# Patient Record
Sex: Female | Born: 1965 | Race: White | Hispanic: No | Smoking: Never smoker
Health system: Southern US, Community
[De-identification: ages and names within clinical notes are randomized; demographics above are authoritative.]

## PROBLEM LIST (undated history)

## (undated) DIAGNOSIS — T4145XA Adverse effect of unspecified anesthetic, initial encounter: Secondary | ICD-10-CM

## (undated) DIAGNOSIS — Z9889 Other specified postprocedural states: Secondary | ICD-10-CM

## (undated) DIAGNOSIS — Z87442 Personal history of urinary calculi: Secondary | ICD-10-CM

## (undated) DIAGNOSIS — C801 Malignant (primary) neoplasm, unspecified: Secondary | ICD-10-CM

## (undated) DIAGNOSIS — R112 Nausea with vomiting, unspecified: Secondary | ICD-10-CM

## (undated) DIAGNOSIS — T8859XA Other complications of anesthesia, initial encounter: Secondary | ICD-10-CM

## (undated) DIAGNOSIS — D045 Carcinoma in situ of skin of trunk: Secondary | ICD-10-CM

## (undated) HISTORY — PX: CHOLECYSTECTOMY: SHX55

## (undated) HISTORY — PX: APPENDECTOMY: SHX54

## (undated) HISTORY — PX: ABDOMINAL HYSTERECTOMY: SHX81

---

## 2006-05-05 ENCOUNTER — Ambulatory Visit (HOSPITAL_BASED_OUTPATIENT_CLINIC_OR_DEPARTMENT_OTHER): Admission: RE | Admit: 2006-05-05 | Discharge: 2006-05-05 | Payer: Self-pay | Admitting: Orthopedic Surgery

## 2007-01-21 ENCOUNTER — Emergency Department (HOSPITAL_COMMUNITY): Admission: EM | Admit: 2007-01-21 | Discharge: 2007-01-21 | Payer: Self-pay | Admitting: Emergency Medicine

## 2007-12-18 ENCOUNTER — Emergency Department (HOSPITAL_COMMUNITY): Admission: EM | Admit: 2007-12-18 | Discharge: 2007-12-18 | Payer: Self-pay | Admitting: Emergency Medicine

## 2010-11-20 NOTE — Op Note (Signed)
[name], Tonya Bowman               ACCOUNT NO.:  [id number]   MEDICAL RECORD NO.:  [id number]          PATIENT TYPE:  AMB   LOCATION:  DSC                          FACILITY:  [facility]   PHYSICIAN:  Nadara Mustard, MD     DATE OF BIRTH:  08-03-1965   DATE OF PROCEDURE:  05/05/2006  DATE OF DISCHARGE:                                 OPERATIVE REPORT   PREOPERATIVE DIAGNOSIS:  Frozen left shoulder.   POSTOPERATIVE DIAGNOSIS:  Frozen left shoulder.   PROCEDURE:  1. Left shoulder manipulation under anesthesia.  2. Left shoulder arthroscopy with debridement rotator cuff tear,      debridement of subscapularis adhesions, and subacromial decompression.   SURGEON:  Nadara Mustard, MD   ANESTHESIA:  General plus interscalene block.   ESTIMATED BLOOD LOSS:  Minimal.   ANTIBIOTICS:  None.   DRAINS:  None.   COMPLICATIONS:  None.   DISPOSITION:  To PACU in stable condition.   PROCEDURE:  The patient is a 45 year old woman with a frozen left shoulder.  She has failed conservative care.  MRI scan showed no significant bony or  intra-articular pathology and the patient presents at this time for  arthroscopic intervention due to failure of conservative care and pain with  activities of daily living.  Risks and benefits were discussed including  infection, neurovascular injury, persistent pain, need for additional  surgery.  The patient states she understands and wished proceed at this  time.   DESCRIPTION OF PROCEDURE:  The patient was brought to OR room one after  undergoing interscalene block.  After adequate level of anesthesia obtained  with general anesthetic, the patient was placed in the beach-chair position  and her left upper extremity was prepped using DuraPrep and draped into a  sterile field.  The scope was inserted through the posterior portal and  anterior portal was established with outside-in technique.  Manipulation  under anesthesia relieved the adhesions from the  subscapularis.  The VAPR  wand was used to further debride the subscapularis adhesions.  The patient  had significant synovitis involving the entire joint and the synovitis was  debrided with the VAPR wand.  The shaver was also used for debridement of a  SLAP lesion.  The biceps tendon was intact.  The subscapularis did have some  degenerative tearing which was debrided.  The insertion of the rotator cuff  on the humeral head was stable and intact and the patient had good articular  cartilage of the glenoid and humeral head was some mild grade 2 changes of  the humeral head articular cartilage.  After further debridement the  instruments were removed.  The scope was then inserted from the posterior  portal in the subacromial space and a lateral portal was established.  The  patient did have a tight subacromial space due to bony spurs from the  anterior aspect of the acromion and the patient underwent a subacromial  decompression and debridement of synovitis in the subacromial space.  After  debridement the instruments were removed.  The joint was infused with total  of 20 mL  of half percent Marcaine plain and 4 mg of morphine.  The incisions  were closed using 4-0 nylon.  The wound was covered with Adaptic orthopedic  sponges, ABD dressing and Hypafix tape.  The patient was placed in a sling,  extubated, taken to PACU in stable condition.  Plan for discharge to home.  Follow-up in office in 2 weeks.  Prescription for Vicodin and Darvocet and  starting range of motion exercises tomorrow.      Nadara Mustard, MD  Electronically Signed     MVD/MEDQ  D:  05/05/2006  T:  05/06/2006  Job:  [id number]

## 2011-04-19 LAB — URINALYSIS, ROUTINE W REFLEX MICROSCOPIC
Bilirubin Urine: NEGATIVE
Ketones, ur: NEGATIVE
Nitrite: NEGATIVE
Urobilinogen, UA: 0.2
pH: 8

## 2011-04-19 LAB — URINE CULTURE: Colony Count: 15000

## 2011-04-19 LAB — URINE MICROSCOPIC-ADD ON

## 2011-08-17 ENCOUNTER — Other Ambulatory Visit: Payer: Self-pay | Admitting: Family Medicine

## 2011-08-17 DIAGNOSIS — Z1231 Encounter for screening mammogram for malignant neoplasm of breast: Secondary | ICD-10-CM

## 2011-08-23 ENCOUNTER — Ambulatory Visit
Admission: RE | Admit: 2011-08-23 | Discharge: 2011-08-23 | Disposition: A | Discharge status: Home / Self Care | Payer: 59 | Source: Ambulatory Visit | Attending: Family Medicine | Admitting: Family Medicine

## 2011-08-23 DIAGNOSIS — Z1231 Encounter for screening mammogram for malignant neoplasm of breast: Secondary | ICD-10-CM

## 2014-04-18 ENCOUNTER — Other Ambulatory Visit: Payer: Self-pay

## 2014-04-18 DIAGNOSIS — Z1231 Encounter for screening mammogram for malignant neoplasm of breast: Secondary | ICD-10-CM

## 2014-04-23 ENCOUNTER — Ambulatory Visit
Admission: RE | Admit: 2014-04-23 | Discharge: 2014-04-23 | Disposition: A | Discharge status: Home / Self Care | Payer: 59 | Source: Ambulatory Visit

## 2014-04-23 ENCOUNTER — Inpatient Hospital Stay: Admission: RE | Admit: 2014-04-23 | Payer: 59 | Source: Ambulatory Visit

## 2014-04-23 DIAGNOSIS — Z1231 Encounter for screening mammogram for malignant neoplasm of breast: Secondary | ICD-10-CM

## 2014-04-25 ENCOUNTER — Other Ambulatory Visit: Payer: Self-pay | Admitting: Family Medicine

## 2014-04-25 DIAGNOSIS — R928 Other abnormal and inconclusive findings on diagnostic imaging of breast: Secondary | ICD-10-CM

## 2014-05-08 ENCOUNTER — Ambulatory Visit: Payer: 59

## 2014-05-08 ENCOUNTER — Ambulatory Visit
Admission: RE | Admit: 2014-05-08 | Discharge: 2014-05-08 | Disposition: A | Discharge status: Home / Self Care | Payer: 59 | Source: Ambulatory Visit | Attending: Family Medicine | Admitting: Family Medicine

## 2014-05-08 DIAGNOSIS — R928 Other abnormal and inconclusive findings on diagnostic imaging of breast: Secondary | ICD-10-CM

## 2016-06-15 ENCOUNTER — Encounter (INDEPENDENT_AMBULATORY_CARE_PROVIDER_SITE_OTHER): Payer: Self-pay | Admitting: *Deleted

## 2016-12-06 ENCOUNTER — Encounter (INDEPENDENT_AMBULATORY_CARE_PROVIDER_SITE_OTHER): Payer: Self-pay | Admitting: *Deleted

## 2018-10-03 ENCOUNTER — Other Ambulatory Visit: Payer: Self-pay | Admitting: Urology

## 2018-10-03 ENCOUNTER — Encounter (HOSPITAL_COMMUNITY)
Admission: AD | Disposition: A | Discharge status: Home / Self Care | Payer: Self-pay | Source: Ambulatory Visit | Attending: Urology

## 2018-10-03 ENCOUNTER — Ambulatory Visit (HOSPITAL_COMMUNITY)
Admission: AD | Admit: 2018-10-03 | Discharge: 2018-10-03 | Disposition: A | Discharge status: Home / Self Care | Payer: 59 | Source: Ambulatory Visit | Attending: Urology | Admitting: Urology

## 2018-10-03 ENCOUNTER — Ambulatory Visit (HOSPITAL_COMMUNITY): Payer: 59 | Admitting: Certified Registered Nurse Anesthetist

## 2018-10-03 ENCOUNTER — Emergency Department (HOSPITAL_COMMUNITY): Payer: 59

## 2018-10-03 ENCOUNTER — Ambulatory Visit (HOSPITAL_COMMUNITY): Payer: 59

## 2018-10-03 ENCOUNTER — Encounter (HOSPITAL_COMMUNITY): Payer: Self-pay | Admitting: *Deleted

## 2018-10-03 ENCOUNTER — Emergency Department (HOSPITAL_COMMUNITY)
Admission: EM | Admit: 2018-10-03 | Discharge: 2018-10-03 | Disposition: A | Discharge status: Home / Self Care | Payer: 59 | Source: Home / Self Care | Attending: Emergency Medicine | Admitting: Emergency Medicine

## 2018-10-03 ENCOUNTER — Other Ambulatory Visit: Payer: Self-pay

## 2018-10-03 DIAGNOSIS — N132 Hydronephrosis with renal and ureteral calculous obstruction: Secondary | ICD-10-CM | POA: Insufficient documentation

## 2018-10-03 DIAGNOSIS — N201 Calculus of ureter: Secondary | ICD-10-CM

## 2018-10-03 HISTORY — DX: Other complications of anesthesia, initial encounter: T88.59XA

## 2018-10-03 HISTORY — DX: Personal history of urinary calculi: Z87.442

## 2018-10-03 HISTORY — DX: Other specified postprocedural states: R11.2

## 2018-10-03 HISTORY — DX: Adverse effect of unspecified anesthetic, initial encounter: T41.45XA

## 2018-10-03 HISTORY — DX: Nausea with vomiting, unspecified: Z98.890

## 2018-10-03 HISTORY — PX: CYSTOSCOPY/URETEROSCOPY/HOLMIUM LASER/STENT PLACEMENT: SHX6546

## 2018-10-03 HISTORY — DX: Nausea with vomiting, unspecified: R11.2

## 2018-10-03 LAB — COMPREHENSIVE METABOLIC PANEL
ALBUMIN: 4 g/dL (ref 3.5–5.0)
ALK PHOS: 92 U/L (ref 38–126)
ALT: 28 U/L (ref 0–44)
ANION GAP: 13 (ref 5–15)
AST: 28 U/L (ref 15–41)
BUN: 13 mg/dL (ref 6–20)
CALCIUM: 9.3 mg/dL (ref 8.9–10.3)
CO2: 19 mmol/L — AB (ref 22–32)
Chloride: 107 mmol/L (ref 98–111)
Creatinine, Ser: 0.73 mg/dL (ref 0.44–1.00)
GFR calc Af Amer: >60 mL/min (ref >60)
GFR calc non Af Amer: >60 mL/min (ref >60)
GLUCOSE: 109 mg/dL — AB (ref 70–99)
Potassium: 3 mmol/L — ABNORMAL LOW (ref 3.5–5.1)
SODIUM: 139 mmol/L (ref 135–145)
TOTAL PROTEIN: 7.8 g/dL (ref 6.5–8.1)
Total Bilirubin: 0.3 mg/dL (ref 0.3–1.2)

## 2018-10-03 LAB — CBC WITH DIFFERENTIAL/PLATELET
Abs Immature Granulocytes: 0.09 K/uL — ABNORMAL HIGH (ref 0.00–0.07)
Basophils Absolute: 0.1 K/uL (ref 0.0–0.1)
Basophils Relative: 1 %
Eosinophils Absolute: 0.1 K/uL (ref 0.0–0.5)
Eosinophils Relative: 1 %
HCT: 42 % (ref 36.0–46.0)
Hemoglobin: 14.2 g/dL (ref 12.0–15.0)
Immature Granulocytes: 1 %
Lymphocytes Relative: 33 %
Lymphs Abs: 3.9 K/uL (ref 0.7–4.0)
MCH: 30.3 pg (ref 26.0–34.0)
MCHC: 33.8 g/dL (ref 30.0–36.0)
MCV: 89.6 fL (ref 80.0–100.0)
Monocytes Absolute: 0.9 K/uL (ref 0.1–1.0)
Monocytes Relative: 7 %
Neutro Abs: 6.6 K/uL (ref 1.7–7.7)
Neutrophils Relative %: 57 %
Platelets: 345 K/uL (ref 150–400)
RBC: 4.69 MIL/uL (ref 3.87–5.11)
RDW: 12.8 % (ref 11.5–15.5)
WBC: 11.7 K/uL — ABNORMAL HIGH (ref 4.0–10.5)
nRBC: 0 % (ref 0.0–0.2)

## 2018-10-03 LAB — LIPASE, BLOOD: Lipase: 43 U/L (ref 11–51)

## 2018-10-03 SURGERY — CYSTOSCOPY/URETEROSCOPY/HOLMIUM LASER/STENT PLACEMENT
Anesthesia: General | Laterality: Right

## 2018-10-03 MED ORDER — ONDANSETRON HCL 4 MG/2ML IJ SOLN
INTRAMUSCULAR | Status: DC | PRN
  Administered 2018-10-03: 4 mg via INTRAVENOUS

## 2018-10-03 MED ORDER — FENTANYL CITRATE (PF) 100 MCG/2ML IJ SOLN
INTRAMUSCULAR | Status: DC | PRN
  Administered 2018-10-03: 25 ug via INTRAVENOUS
  Administered 2018-10-03: 50 ug via INTRAVENOUS
  Administered 2018-10-03: 25 ug via INTRAVENOUS

## 2018-10-03 MED ORDER — FENTANYL CITRATE (PF) 100 MCG/2ML IJ SOLN
INTRAMUSCULAR | Status: AC
  Filled 2018-10-03: qty 2

## 2018-10-03 MED ORDER — FENTANYL CITRATE (PF) 100 MCG/2ML IJ SOLN
25.0000 ug | INTRAMUSCULAR | Status: DC | PRN
  Administered 2018-10-03 (×2): 25 ug via INTRAVENOUS

## 2018-10-03 MED ORDER — SODIUM CHLORIDE 0.9 % IR SOLN
Status: DC | PRN
  Administered 2018-10-03: 3000 mL via INTRAVESICAL

## 2018-10-03 MED ORDER — PROMETHAZINE HCL 25 MG/ML IJ SOLN
INTRAMUSCULAR | Status: AC
  Filled 2018-10-03: qty 1

## 2018-10-03 MED ORDER — KETOROLAC TROMETHAMINE 30 MG/ML IJ SOLN: 30.0000 mg | Freq: Once | INTRAMUSCULAR | Status: DC | PRN

## 2018-10-03 MED ORDER — HYDROCODONE-ACETAMINOPHEN 5-325 MG PO TABS: 1.0000 | ORAL_TABLET | ORAL | 0 refills | Status: DC | PRN

## 2018-10-03 MED ORDER — PROPOFOL 10 MG/ML IV BOLUS
INTRAVENOUS | Status: DC | PRN
  Administered 2018-10-03: 150 mg via INTRAVENOUS

## 2018-10-03 MED ORDER — TAMSULOSIN HCL 0.4 MG PO CAPS: 0.4000 mg | ORAL_CAPSULE | Freq: Every day | ORAL | 3 refills | Status: DC

## 2018-10-03 MED ORDER — PROPOFOL 10 MG/ML IV BOLUS
INTRAVENOUS | Status: AC
  Filled 2018-10-03: qty 20

## 2018-10-03 MED ORDER — DEXAMETHASONE SODIUM PHOSPHATE 10 MG/ML IJ SOLN
INTRAMUSCULAR | Status: DC | PRN
  Administered 2018-10-03: 5 mg via INTRAVENOUS

## 2018-10-03 MED ORDER — MORPHINE SULFATE (PF) 4 MG/ML IV SOLN
4.0000 mg | Freq: Once | INTRAVENOUS | Status: AC
  Administered 2018-10-03: 4 mg via INTRAVENOUS
  Filled 2018-10-03: qty 1

## 2018-10-03 MED ORDER — IOHEXOL 300 MG/ML  SOLN
INTRAMUSCULAR | Status: DC | PRN
  Administered 2018-10-03: 10 mL via URETHRAL

## 2018-10-03 MED ORDER — LIDOCAINE 2% (20 MG/ML) 5 ML SYRINGE
INTRAMUSCULAR | Status: AC
  Filled 2018-10-03: qty 5

## 2018-10-03 MED ORDER — POTASSIUM CHLORIDE CRYS ER 20 MEQ PO TBCR: 40.0000 meq | EXTENDED_RELEASE_TABLET | Freq: Every day | ORAL | 0 refills | Status: DC

## 2018-10-03 MED ORDER — KETOROLAC TROMETHAMINE 30 MG/ML IJ SOLN
30.0000 mg | Freq: Once | INTRAMUSCULAR | Status: AC
  Administered 2018-10-03: 30 mg via INTRAVENOUS
  Filled 2018-10-03: qty 1

## 2018-10-03 MED ORDER — MIDAZOLAM HCL 2 MG/2ML IJ SOLN
INTRAMUSCULAR | Status: AC
  Filled 2018-10-03: qty 2

## 2018-10-03 MED ORDER — HYDROMORPHONE HCL 1 MG/ML IJ SOLN
1.0000 mg | Freq: Once | INTRAMUSCULAR | Status: AC
  Administered 2018-10-03: 1 mg via INTRAVENOUS
  Filled 2018-10-03: qty 1

## 2018-10-03 MED ORDER — SCOPOLAMINE 1 MG/3DAYS TD PT72
1.0000 | MEDICATED_PATCH | TRANSDERMAL | Status: DC
  Administered 2018-10-03: 1.5 mg via TRANSDERMAL

## 2018-10-03 MED ORDER — KETOROLAC TROMETHAMINE 10 MG PO TABS: 10.0000 mg | ORAL_TABLET | Freq: Four times a day (QID) | ORAL | 0 refills | Status: DC | PRN

## 2018-10-03 MED ORDER — PROMETHAZINE HCL 25 MG/ML IJ SOLN
6.2500 mg | INTRAMUSCULAR | Status: DC | PRN
  Administered 2018-10-03: 6.25 mg via INTRAVENOUS

## 2018-10-03 MED ORDER — LIDOCAINE 2% (20 MG/ML) 5 ML SYRINGE
INTRAMUSCULAR | Status: AC
  Filled 2018-10-03: qty 10

## 2018-10-03 MED ORDER — MIDAZOLAM HCL 5 MG/5ML IJ SOLN
INTRAMUSCULAR | Status: DC | PRN
  Administered 2018-10-03: 2 mg via INTRAVENOUS

## 2018-10-03 MED ORDER — LACTATED RINGERS IV SOLN
INTRAVENOUS | Status: DC
  Administered 2018-10-03 (×2): via INTRAVENOUS

## 2018-10-03 MED ORDER — LIDOCAINE 2% (20 MG/ML) 5 ML SYRINGE
INTRAMUSCULAR | Status: DC | PRN
  Administered 2018-10-03: 60 mg via INTRAVENOUS

## 2018-10-03 MED ORDER — ONDANSETRON HCL 4 MG PO TABS: 4.0000 mg | ORAL_TABLET | Freq: Three times a day (TID) | ORAL | 3 refills | Status: DC | PRN

## 2018-10-03 MED ORDER — BELLADONNA ALKALOIDS-OPIUM 16.2-30 MG RE SUPP
RECTAL | Status: AC
  Filled 2018-10-03: qty 1

## 2018-10-03 MED ORDER — INDIGOTINDISULFONATE SODIUM 8 MG/ML IJ SOLN
INTRAMUSCULAR | Status: AC
  Filled 2018-10-03: qty 5

## 2018-10-03 MED ORDER — ONDANSETRON 4 MG PO TBDP
ORAL_TABLET | ORAL | Status: AC
  Administered 2018-10-03: 15:00:00
  Filled 2018-10-03: qty 1

## 2018-10-03 MED ORDER — ONDANSETRON HCL 4 MG/2ML IJ SOLN
4.0000 mg | Freq: Once | INTRAMUSCULAR | Status: AC
  Administered 2018-10-03: 4 mg via INTRAVENOUS
  Filled 2018-10-03: qty 2

## 2018-10-03 MED ORDER — SUCCINYLCHOLINE CHLORIDE 200 MG/10ML IV SOSY
PREFILLED_SYRINGE | INTRAVENOUS | Status: DC | PRN
  Administered 2018-10-03: 120 mg via INTRAVENOUS

## 2018-10-03 MED ORDER — ONDANSETRON HCL 4 MG/2ML IJ SOLN
INTRAMUSCULAR | Status: AC
  Filled 2018-10-03: qty 2

## 2018-10-03 MED ORDER — SCOPOLAMINE 1 MG/3DAYS TD PT72
MEDICATED_PATCH | TRANSDERMAL | Status: AC
  Filled 2018-10-03: qty 1

## 2018-10-03 MED ORDER — ALBUTEROL SULFATE HFA 108 (90 BASE) MCG/ACT IN AERS
INHALATION_SPRAY | RESPIRATORY_TRACT | Status: AC
  Filled 2018-10-03: qty 6.7

## 2018-10-03 MED ORDER — CIPROFLOXACIN IN D5W 400 MG/200ML IV SOLN
400.0000 mg | INTRAVENOUS | Status: AC
  Administered 2018-10-03: 400 mg via INTRAVENOUS
  Filled 2018-10-03: qty 200

## 2018-10-03 SURGICAL SUPPLY — 23 items
BAG URO CATCHER STRL LF (MISCELLANEOUS) ×3 IMPLANT
BASKET LASER NITINOL 1.9FR (BASKET) ×2 IMPLANT
BASKET ZERO TIP NITINOL 2.4FR (BASKET) IMPLANT
BSKT STON RTRVL 120 1.9FR (BASKET) ×1
BSKT STON RTRVL ZERO TP 2.4FR (BASKET)
CATH INTERMIT  6FR 70CM (CATHETERS) IMPLANT
CATH URET 5FR 28IN CONE TIP (BALLOONS)
CATH URET 5FR 70CM CONE TIP (BALLOONS) IMPLANT
CLOTH BEACON ORANGE TIMEOUT ST (SAFETY) ×3 IMPLANT
COVER WAND RF STERILE (DRAPES) IMPLANT
EXTRACTOR STONE 1.7FRX115CM (UROLOGICAL SUPPLIES) IMPLANT
FIBER LASER FLEXIVA 365 (UROLOGICAL SUPPLIES) IMPLANT
FIBER LASER TRAC TIP (UROLOGICAL SUPPLIES) ×2 IMPLANT
GLOVE BIO SURGEON STRL SZ7.5 (GLOVE) ×3 IMPLANT
GOWN STRL REUS W/TWL XL LVL3 (GOWN DISPOSABLE) ×3 IMPLANT
GUIDEWIRE ANG ZIPWIRE 038X150 (WIRE) IMPLANT
GUIDEWIRE STR DUAL SENSOR (WIRE) ×3 IMPLANT
KIT TURNOVER KIT A (KITS) IMPLANT
MANIFOLD NEPTUNE II (INSTRUMENTS) ×3 IMPLANT
PACK CYSTO (CUSTOM PROCEDURE TRAY) ×3 IMPLANT
SHEATH URETERAL 12FRX28CM (UROLOGICAL SUPPLIES) IMPLANT
SHEATH URETERAL 12FRX35CM (MISCELLANEOUS) IMPLANT
TUBING UROLOGY SET (TUBING) ×3 IMPLANT

## 2018-10-03 NOTE — ED Provider Notes (Signed)
Emergency Department Provider Note   I have reviewed the triage vital signs and the nursing notes.   HISTORY  Chief Complaint Flank Pain   HPI Tonya Bowman is a 53 y.o. female with PSxH of appendectomy, cholecystectomy, and abdominal hysterectomy sent to the emergency department for acute onset right flank pain radiating to the back.  Patient states symptoms began abruptly while at work.  She had one episode of vomiting and several bowel movements which were not bloody.  She denies fevers.  No similar pain in the past.  Pain is severe and intermittently worsens.  No lower abdominal discomfort.  No UTI symptoms.  No hematuria.    No past medical history on file.  There are no active problems to display for this patient.   Allergies Penicillins  No family history on file.  Social History Social History   Tobacco Use  . Smoking status: Never Smoker  . Smokeless tobacco: Never Used  Substance Use Topics  . Alcohol use: Not Currently  . Drug use: Not Currently    Review of Systems  Constitutional: No fever/chills Eyes: No visual changes. ENT: No sore throat. Cardiovascular: Denies chest pain. Respiratory: Denies shortness of breath. Gastrointestinal: Severe right flank/abdominal pain. Positive nausea and vomiting x 1.   No diarrhea.  No constipation. Genitourinary: Negative for dysuria. Musculoskeletal: Negative for back pain. Skin: Negative for rash. Neurological: Negative for headaches, focal weakness or numbness.  10-point ROS otherwise negative.  ____________________________________________   PHYSICAL EXAM:  VITAL SIGNS: ED Triage Vitals  Enc Vitals Group     BP 10/03/18 1048 (!) 135/94     Pulse Rate 10/03/18 1048 79     Resp 10/03/18 1048 (!) 22     Temp 10/03/18 1048 98.3 F (36.8 C)     Temp Source 10/03/18 1048 Oral     SpO2 10/03/18 1048 100 %     Pain Score 10/03/18 1045 10   Constitutional: Alert and oriented. Frequently shifting in  bed. Appears uncomfortable.  Eyes: Conjunctivae are normal.  Head: Atraumatic. Nose: No congestion/rhinnorhea. Mouth/Throat: Mucous membranes are moist. Neck: No stridor.  Cardiovascular: Normal rate, regular rhythm. Good peripheral circulation. Grossly normal heart sounds.   Respiratory: Normal respiratory effort.  No retractions. Lungs CTAB. Gastrointestinal: Soft and non-tender to diffuse palpation in all 4 quadrants. No distention. Moderate right CVA tenderness to percussion.  Musculoskeletal: No lower extremity tenderness nor edema. No gross deformities of extremities. Neurologic:  Normal speech and language. No gross focal neurologic deficits are appreciated.  Skin:  Skin is warm, dry and intact. No rash noted.  ____________________________________________   LABS (all labs ordered are listed, but only abnormal results are displayed)  Labs Reviewed  COMPREHENSIVE METABOLIC PANEL - Abnormal; Notable for the following components:      Result Value   Potassium 3.0 (*)    CO2 19 (*)    Glucose, Bld 109 (*)    All other components within normal limits  CBC WITH DIFFERENTIAL/PLATELET - Abnormal; Notable for the following components:   WBC 11.7 (*)    Abs Immature Granulocytes 0.09 (*)    All other components within normal limits  URINE CULTURE  LIPASE, BLOOD  URINALYSIS, ROUTINE W REFLEX MICROSCOPIC   ____________________________________________  RADIOLOGY  Ct Renal Stone Study  Result Date: 10/03/2018 CLINICAL DATA:  Right flank pain. EXAM: CT ABDOMEN AND PELVIS WITHOUT CONTRAST TECHNIQUE: Multidetector CT imaging of the abdomen and pelvis was performed following the standard protocol without IV contrast.  COMPARISON:  CT abdomen pelvis dated Nov 05, 2009. FINDINGS: Lower chest: No acute abnormality. Hepatobiliary: No focal liver abnormality is seen. Status post cholecystectomy. Unchanged mild central intrahepatic and common bile duct dilatation, likely due to post  cholecystectomy state. Pancreas: Unremarkable. No pancreatic ductal dilatation or surrounding inflammatory changes. Spleen: Normal in size without focal abnormality. Adrenals/Urinary Tract: The adrenal glands are unremarkable. There is a 7 mm calculus in the distal right ureter approximately 1 cm from the UVJ with resultant mild right hydroureteronephrosis. Punctate nonobstructive left renal calculus. No hydronephrosis. The bladder is under distended. Stomach/Bowel: Stomach is within normal limits. Appendix is not visualized in this patient with history of prior appendectomy. No evidence of bowel wall thickening, distention, or inflammatory changes. Vascular/Lymphatic: No significant vascular findings are present. No enlarged abdominal or pelvic lymph nodes. Reproductive: Status post hysterectomy. No adnexal masses. Other: No abdominal wall hernia. No abdominopelvic ascites. No pneumoperitoneum. Musculoskeletal: No acute or significant osseous findings. L5 central superior endplate Schmorl's node, new since 2011. Slight interval increase in size of a superior right rectus abdominus intramuscular lipoma, now measuring 8 x 24 x 59 mm, previously 7 x 17 x 52 mm. IMPRESSION: 1. 7 mm calculus in the distal right ureter with resultant mild right hydroureteronephrosis. 2. Punctate nonobstructive left nephrolithiasis. Electronically Signed   By: Titus Dubin M.D.   On: 10/03/2018 12:47    ____________________________________________   PROCEDURES  Procedure(s) performed:   Procedures  None  ____________________________________________   INITIAL IMPRESSION / ASSESSMENT AND PLAN / ED COURSE  Pertinent labs & imaging results that were available during my care of the patient were reviewed by me and considered in my medical decision making (see chart for details).   Patient presents to the emergency department with acute onset right flank pain.  Presentation is most consistent with ureterolithiasis.   Patient has history of abdominal hysterectomy, appendectomy, cholecystectomy.  No tenderness to the anterior abdomen.  Lower suspicion for bowel obstruction.  Plan for CT renal along with labs and pain/nausea medication.  01:30 PM  Spoke with Dr. Gloriann Loan with Urology.  With a distal, 7 mm stone 1 cm from the UVJ.  Patient continues to have severe pain.  No significant relief with morphine or Toradol at this point.  I am giving Dilaudid.  Dr. Gloriann Loan can see the patient in the office this afternoon for possible intervention.  No symptoms to suspect a developing urosepsis.  UA with no evidence of infection.  Remaining lab work is largely unremarkable.  Patient is calling for a ride and will let us know when she has secured transportation.   02:05 PM  Patient has secured a ride home with her daughter who is en route. Provided an Rx for Toradol and Kdur. Discussed hypokalemia. Patient to drive to [facility] in [place] to see Dr. Gloriann Loan. They will prescribe pain Rx as needed. Patient understands discharge plan and instructions. Appears more comfortable here in the ED at the time of discharge.  ____________________________________________  FINAL CLINICAL IMPRESSION(S) / ED DIAGNOSES  Final diagnoses:  Ureterolithiasis     MEDICATIONS GIVEN DURING THIS VISIT:  Medications  morphine 4 MG/ML injection 4 mg (4 mg Intravenous Given 10/03/18 1106)  ondansetron (ZOFRAN) injection 4 mg (4 mg Intravenous Given 10/03/18 1106)  ketorolac (TORADOL) 30 MG/ML injection 30 mg (30 mg Intravenous Given 10/03/18 1139)  HYDROmorphone (DILAUDID) injection 1 mg (1 mg Intravenous Given 10/03/18 1336)     NEW OUTPATIENT MEDICATIONS STARTED DURING THIS VISIT:  New Prescriptions   KETOROLAC (TORADOL) 10 MG TABLET    Take 1 tablet (10 mg total) by mouth every 6 (six) hours as needed for moderate pain.   POTASSIUM CHLORIDE SA (K-DUR,KLOR-CON) 20 MEQ TABLET    Take 2 tablets (40 mEq total) by mouth daily for 5 days.     Note:  This document was prepared using Dragon voice recognition software and may include unintentional dictation errors.  Nanda Quinton, MD Emergency Medicine    Xavier Munger, [facility], MD 10/03/18 [phone]

## 2018-10-03 NOTE — H&P (Signed)
CC/HPI: CC: Right ureteral calculus  HPI:  10/03/2018  Patient presents with severe right-sided flank pain, nausea, vomiting. She denies fever or chill. She comes from the emergency department where she underwent a CT scan of the abdomen and pelvis without contrast that revealed a 7 mm distal right ureteral calculus with right hydroureteronephrosis. She also had a punctate nonobstructive left stone in the kidney. This her first event. Despite medication, she continues to have severe nausea, vomiting. She is also having continued pain. She desires intervention tonight. In the emergency department, she had very mild leukocytosis of 11.7. Creatinine is 0.73. Urinalysis in the office reveals moderate bacteria (25-30), 0-5 WBCs, 20-40 RBCs. She had negative nitrite, trace leukocyte.     ALLERGIES: penicillin Penicillins    MEDICATIONS: None   GU PSH: Hysterectomy Unilat SO - 2011      PSH Notes: Appendectomy, Gallbladder Surgery, Hysterectomy   NON-GU PSH: Appendectomy - 2011 Appendectomy (laparoscopic) Cholecystectomy (laparoscopic)    GU PMH: Dysuria, Dysuria - 2014 Oth GU systems Signs/Symptoms, Bladder pain - 2014      PMH Notes:  1898-07-05 00:00:00 - Note: Normal Routine History And Physical Adult   NON-GU PMH: Personal history of other diseases of the digestive system, History of esophageal reflux - 2014    FAMILY HISTORY: Depression - Father, Sister Diabetes - Mother Family Health Status Number - Runs In Family Hypertension - Father, Sister, Brother, Mother nephrolithiasis - Brother, Sister   SOCIAL HISTORY: No Social History     Notes: Marital History - Single, Caffeine Use, Tobacco Use, Occupation:, Alcohol Use   REVIEW OF SYSTEMS:    GU Review Female:   Patient denies frequent urination, hard to postpone urination, burning /pain with urination, get up at night to urinate, leakage of urine, stream starts and stops, trouble starting your stream, have to strain to urinate,  and being pregnant.  Gastrointestinal (Upper):   Patient reports nausea and vomiting. Patient denies indigestion/ heartburn.  Gastrointestinal (Lower):   Patient denies diarrhea and constipation.  Constitutional:   Patient denies fever, night sweats, weight loss, and fatigue.  Skin:   Patient denies skin rash/ lesion and itching.  Eyes:   Patient denies blurred vision and double vision.  Ears/ Nose/ Throat:   Patient denies sore throat and sinus problems.  Hematologic/Lymphatic:   Patient denies swollen glands and easy bruising.  Cardiovascular:   Patient denies leg swelling and chest pains.  Respiratory:   Patient denies cough and shortness of breath.  Endocrine:   Patient denies excessive thirst.  Musculoskeletal:   Patient reports back pain. Patient denies joint pain.  Neurological:   Patient denies headaches and dizziness.  Psychologic:   Patient denies depression and anxiety.   VITAL SIGNS:      10/03/2018 03:19 PM  BP 138/88 mmHg  Heart Rate 112 /min   MULTI-SYSTEM PHYSICAL EXAMINATION:    Constitutional: Well-nourished. No physical deformities. Appears uncomfortable secondary to renal colic and nausea  Respiratory: No labored breathing, no use of accessory muscles.   Cardiovascular: Normal temperature, adequate perfusion of extremities  Skin: No paleness, no jaundice  Neurologic / Psychiatric: Oriented to time, oriented to place, oriented to person. No depression, no anxiety, no agitation.  Gastrointestinal: No mass, no tenderness, no rigidity, non obese abdomen.   Eyes: Normal conjunctivae. Normal eyelids.  Musculoskeletal: Normal gait and station of head and neck.     PAST DATA REVIEWED:  Source Of History:  Patient  Lab Test Review:   CBC with  Diff, CMP  Records Review:   Previous Patient Records  X-Ray Review: C.T. Abdomen/Pelvis: Reviewed Films. Reviewed Report. Discussed With Patient.     PROCEDURES:          Urinalysis w/Scope Dipstick Dipstick Cont'd Micro   Color: Amber Bilirubin: Neg mg/dL WBC/hpf: 0 - 5/hpf  Appearance: Cloudy Ketones: Neg mg/dL RBC/hpf: 20 - 40/hpf  Specific Gravity: 1.015 Blood: 3+ ery/uL Bacteria: Mod (26-50/hpf)  pH: 8.0 Protein: 1+ mg/dL Cystals: Amorph Phosphates  Glucose: Neg mg/dL Urobilinogen: 0.2 mg/dL Casts: NS (Not Seen)    Nitrites: Neg Trichomonas: Not Present    Leukocyte Esterase: Trace leu/uL Mucous: Present      Epithelial Cells: 0 - 5/hpf      Yeast: NS (Not Seen)      Sperm: Not Present         Phenergan 25mg  - [id number], [id number] Qty: 25 Adm. By: Alcide Goodness  Unit: mg Lot No [id number]  Route: IM Exp. Date 02/03/2020  Freq: None Mfgr.:   Site: Right Hip   ASSESSMENT:      ICD-10 Details  1 GU:   Ureteral calculus - I96.7   2   Renal colic - E93   3 NON-GU:   Nausea with vomiting, unspecified - R11.2    PLAN:           Orders Labs Urine Culture          Document Letter(s):  Created for Patient: Clinical Summary         Notes:   I discussed different options for stone management. She wants something done tonight. I discussed ureteroscopy with laser lithotripsy and ureteral stent placement. I did discuss that if I unObstruct the Kidney and Purulent Material effluxes, then I will only place a stent. However, I will plan to proceed with ureteroscopy with laser lithotripsy and ureteral stent placement if she has clear reflux. Urinalysis is not overly suggestive of infection and she has not had a fever. She understands the potential for bleeding, infection, injury to surrounding structures, need for additional procedure, potential ureteral transection.   Cc: Kathyrn Lass, M.D.   Signed by Link Snuffer, III, M.D. on 10/03/18 at 3:58 PM (EDT

## 2018-10-03 NOTE — Anesthesia Postprocedure Evaluation (Signed)
Anesthesia Post Note  Patient: Tonya Bowman  Procedure(s) Performed: CYSTOSCOPY/URETEROSCOPY/HOLMIUM LASER/STENT PLACEMENT (Right )     Patient location during evaluation: PACU Anesthesia Type: General Level of consciousness: awake and alert Pain management: pain level controlled Vital Signs Assessment: post-procedure vital signs reviewed and stable Respiratory status: spontaneous breathing, nonlabored ventilation, respiratory function stable and patient connected to nasal cannula oxygen Cardiovascular status: blood pressure returned to baseline and stable Postop Assessment: no apparent nausea or vomiting Anesthetic complications: no    Last Vitals:  Vitals:   10/03/18 1755 10/03/18 1800  BP: 121/82 120/76  Pulse: 98 91  Resp: 16 14  Temp: 36.7 C   SpO2: 100% 99%    Last Pain:  Vitals:   10/03/18 1755  TempSrc:   PainSc: 0-No pain                 Marline Morace S

## 2018-10-03 NOTE — Discharge Instructions (Addendum)

## 2018-10-03 NOTE — ED Triage Notes (Signed)
Pt c/o severe right abdominal and flank pain that started x one hour ago; pt states she has vomited x one and states she has no urinary sx; pt states she has had 3 BMs since the pain started

## 2018-10-03 NOTE — Discharge Instructions (Signed)
You have been seen in the Emergency Department (ED) today for pain that we believe based on your workup, is caused by kidney stones.  As we have discussed, please drink plenty of fluids.  Please make a follow up appointment with the physician(s) listed elsewhere in this documentation.  You may take pain medication as needed but ONLY as prescribed.  Please also take your prescribed Flomax daily.  We also recommend that you take over-the-counter ibuprofen regularly according to label instructions over the next 5 days.  Take it with meals to minimize stomach discomfort.  Please see your doctor as soon as possible as stones may take 1-3 weeks to pass and you may require additional care or medications.  Do not drink alcohol, drive or participate in any other potentially dangerous activities while taking opiate pain medication as it may make you sleepy. Do not take this medication with any other sedating medications, either prescription or over-the-counter. If you were prescribed Percocet or Vicodin, do not take these with acetaminophen (Tylenol) as it is already contained within these medications.   Return to the Emergency Department (ED) or call your doctor if you have any worsening pain, fever, painful urination, are unable to urinate, or develop other symptoms that concern you.    Kidney Stones Kidney stones (urolithiasis) are deposits that form inside your kidneys. The intense pain is caused by the stone moving through the urinary tract. When the stone moves, the ureter goes into spasm around the stone. The stone is usually passed in the urine.  CAUSES  A disorder that makes certain neck glands produce too much parathyroid hormone (primary hyperparathyroidism). A buildup of uric acid crystals, similar to gout in your joints. Narrowing (stricture) of the ureter. A kidney obstruction present at birth (congenital obstruction). Previous surgery on the kidney or ureters. Numerous kidney  infections. SYMPTOMS  Feeling sick to your stomach (nauseous). Throwing up (vomiting). Blood in the urine (hematuria). Pain that usually spreads (radiates) to the groin. Frequency or urgency of urination. DIAGNOSIS  Taking a history and physical exam. Blood or urine tests. CT scan. Occasionally, an examination of the inside of the urinary bladder (cystoscopy) is performed. TREATMENT  Observation. Increasing your fluid intake. Extracorporeal shock wave lithotripsy--This is a noninvasive procedure that uses shock waves to break up kidney stones. Surgery may be needed if you have severe pain or persistent obstruction. There are various surgical procedures. Most of the procedures are performed with the use of small instruments. Only small incisions are needed to accommodate these instruments, so recovery time is minimized. The size, location, and chemical composition are all important variables that will determine the proper choice of action for you. Talk to your health care provider to better understand your situation so that you will minimize the risk of injury to yourself and your kidney.  HOME CARE INSTRUCTIONS  Drink enough water and fluids to keep your urine clear or pale yellow. This will help you to pass the stone or stone fragments. Strain all urine through the provided strainer. Keep all particulate matter and stones for your health care provider to see. The stone causing the pain may be as small as a grain of salt. It is very important to use the strainer each and every time you pass your urine. The collection of your stone will allow your health care provider to analyze it and verify that a stone has actually passed. The stone analysis will often identify what you can do to reduce the incidence of  recurrences. Only take over-the-counter or prescription medicines for pain, discomfort, or fever as directed by your health care provider. Keep all follow-up visits as told by your health care  provider. This is important. Get follow-up X-rays if required. The absence of pain does not always mean that the stone has passed. It may have only stopped moving. If the urine remains completely obstructed, it can cause loss of kidney function or even complete destruction of the kidney. It is your responsibility to make sure X-rays and follow-ups are completed. Ultrasounds of the kidney can show blockages and the status of the kidney. Ultrasounds are not associated with any radiation and can be performed easily in a matter of minutes. Make changes to your daily diet as told by your health care provider. You may be told to: Limit the amount of salt that you eat. Eat 5 or more servings of fruits and vegetables each day. Limit the amount of meat, poultry, fish, and eggs that you eat. Collect a 24-hour urine sample as told by your health care provider. You may need to collect another urine sample every 6-12 months. SEEK MEDICAL CARE IF: You experience pain that is progressive and unresponsive to any pain medicine you have been prescribed. SEEK IMMEDIATE MEDICAL CARE IF:  Pain cannot be controlled with the prescribed medicine. You have a fever or shaking chills. The severity or intensity of pain increases over 18 hours and is not relieved by pain medicine. You develop a new onset of abdominal pain. You feel faint or pass out. You are unable to urinate.   This information is not intended to replace advice given to you by your health care provider. Make sure you discuss any questions you have with your health care provider.   Document Released: 06/21/2005 Document Revised: 03/12/2015 Document Reviewed: 11/22/2012 Elsevier Interactive Patient Education [employer].

## 2018-10-03 NOTE — Anesthesia Preprocedure Evaluation (Signed)
Anesthesia Evaluation  Patient identified by MRN, date of birth, ID band Patient awake    Reviewed: Allergy & Precautions, NPO status , Patient's Chart, lab work & pertinent test results  Airway Mallampati: II  TM Distance: >3 FB Neck ROM: Full    Dental no notable dental hx.    Pulmonary neg pulmonary ROS,    Pulmonary exam normal breath sounds clear to auscultation       Cardiovascular negative cardio ROS Normal cardiovascular exam Rhythm:Regular Rate:Normal     Neuro/Psych negative neurological ROS  negative psych ROS   GI/Hepatic negative GI ROS, Neg liver ROS,   Endo/Other  negative endocrine ROS  Renal/GU negative Renal ROS  negative genitourinary   Musculoskeletal negative musculoskeletal ROS (+)   Abdominal   Peds negative pediatric ROS (+)  Hematology negative hematology ROS (+)   Anesthesia Other Findings   Reproductive/Obstetrics negative OB ROS                             Anesthesia Physical Anesthesia Plan  ASA: I  Anesthesia Plan: General   Post-op Pain Management:    Induction: Intravenous  PONV Risk Score and Plan: 3 and Ondansetron, Dexamethasone and Treatment may vary due to age or medical condition  Airway Management Planned: LMA and Oral ETT  Additional Equipment:   Intra-op Plan:   Post-operative Plan: Extubation in OR  Informed Consent: I have reviewed the patients History and Physical, chart, labs and discussed the procedure including the risks, benefits and alternatives for the proposed anesthesia with the patient or authorized representative who has indicated his/her understanding and acceptance.     Dental advisory given  Plan Discussed with: CRNA and Surgeon  Anesthesia Plan Comments:         Anesthesia Quick Evaluation

## 2018-10-03 NOTE — Transfer of Care (Signed)
Immediate Anesthesia Transfer of Care Note  Patient: Tonya Bowman  Procedure(s) Performed: CYSTOSCOPY/URETEROSCOPY/HOLMIUM LASER/STENT PLACEMENT (Right )  Patient Location: PACU  Anesthesia Type:General  Level of Consciousness: awake, drowsy and patient cooperative  Airway & Oxygen Therapy: Patient Spontanous Breathing and Patient connected to face mask oxygen  Post-op Assessment: Report given to RN and Post -op Vital signs reviewed and stable  Post vital signs: Reviewed and stable  Last Vitals:  Vitals Value Taken Time  BP 121/82 10/03/2018  5:53 PM  Temp    Pulse 99 10/03/2018  5:57 PM  Resp 19 10/03/2018  5:57 PM  SpO2 98 % 10/03/2018  5:57 PM  Vitals shown include unvalidated device data.  Last Pain:  Vitals:   10/03/18 1755  TempSrc:   PainSc: (P) 0-No pain         Complications: No apparent anesthesia complications

## 2018-10-03 NOTE — Anesthesia Procedure Notes (Signed)
Procedure Name: Intubation Date/Time: 10/03/2018 4:55 PM Performed by: West Pugh, CRNA Pre-anesthesia Checklist: Patient identified, Emergency Drugs available, Suction available, Patient being monitored and Timeout performed Patient Re-evaluated:Patient Re-evaluated prior to induction Oxygen Delivery Method: Circle system utilized Preoxygenation: Pre-oxygenation with 100% oxygen Induction Type: IV induction, Rapid sequence and Cricoid Pressure applied Laryngoscope Size: Mac and 4 Grade View: Grade II Tube type: Oral Tube size: 7.0 mm Number of attempts: 1 Airway Equipment and Method: Stylet Placement Confirmation: ETT inserted through vocal cords under direct vision,  positive ETCO2,  CO2 detector and breath sounds checked- equal and bilateral Secured at: 22 cm Tube secured with: Tape Dental Injury: Teeth and Oropharynx as per pre-operative assessment

## 2018-10-03 NOTE — Op Note (Signed)
Operative Note  Preoperative diagnosis:  1.  Right ureteral calculus  Postoperative diagnosis: 1.  Right ureteral calculus  Procedure(s): 1.  Cystoscopy with right retrograde pyelogram, right ureteroscopy with laser lithotripsy and ureteral stent placement  Surgeon: Link Snuffer, MD  Assistants: None  Anesthesia: General  Complications: None immediate  EBL: Minimal  Specimens: 1.  None  Drains/Catheters: 1.  6 x 24 double-J ureteral stent  Intraoperative findings: 1.  Normal urethra and bladder 2.  Distal right ureteral calculus fragmented to tiny fragments.  Retrograde pyelogram after this revealed a hydronephrotic kidney.  No filling defects.  Indication: 53 year old female presented with a right distal ureteral calculus.  Her pain and nausea were for refractory to medical management.  She therefore presents for definitive management.  Description of procedure:  The patient was identified and consent was obtained.  The patient was taken to the operating room and placed in the supine position.  The patient was placed under general anesthesia.  Perioperative antibiotics were administered.  The patient was placed in dorsal lithotomy.  Patient was prepped and draped in a standard sterile fashion and a timeout was performed.  A 21 French rigid cystoscope was advanced into the urethra and into the bladder.  Complete cystoscopy was performed with the findings noted above.  The right ureter was cannulated with a sensor wire which was advanced up to the kidney under fluoroscopic guidance.  A semirigid ureteroscope was advanced alongside the wire up to the stone of interest which was fragmented to tiny fragments.  I then advanced the scope up to the renal pelvis and no ureteral calculi were seen.  I shot a retrograde pyelogram through the scope with the findings noted above followed by removal of the scope.  No significant ureteral calculi were seen.  There was some periureteral edema  around the level of the stone.  I then backloaded the wire onto a rigid cystoscope and advanced that into the bladder.  A 6 x 24 double-J ureteral stent was placed in a standard fashion followed by removal of the wire.  Fluoroscopy confirmed proximal placement and direct visualization confirmed a good coil within the bladder.  I drained the bladder and withdrew the scope.  The patient tolerated procedure well and was stable postoperatively.  Plan: return in 1 week for stent removal

## 2018-10-03 NOTE — Progress Notes (Signed)
SPOKE W/  Patient in person     SCREENING SYMPTOMS OF COVID 19:   COUGH--no  RUNNY NOSE--- no  SORE THROAT---no  SHORTNESS OF BREATH---no  DIFFICULTY BREATHING---no  TEMP >100.4-----no  HAVE YOU OR ANY FAMILY MEMBER TRAVELLED PAST 14 DAYS OUT OF THE   COUNTY---yes ([place] and [place]) STATE----no COUNTRY----no  HAVE YOU OR ANY FAMILY MEMBER BEEN EXPOSED TO ANYONE WITH COVID 19?   no

## 2018-10-05 ENCOUNTER — Encounter (HOSPITAL_COMMUNITY): Payer: Self-pay | Admitting: *Deleted

## 2018-10-05 ENCOUNTER — Other Ambulatory Visit: Payer: Self-pay

## 2018-10-05 ENCOUNTER — Encounter (HOSPITAL_COMMUNITY)
Admission: AD | Disposition: A | Discharge status: Home / Self Care | Payer: Self-pay | Source: Ambulatory Visit | Attending: Urology

## 2018-10-05 ENCOUNTER — Ambulatory Visit (HOSPITAL_COMMUNITY)
Admission: AD | Admit: 2018-10-05 | Discharge: 2018-10-05 | Disposition: A | Discharge status: Home / Self Care | Payer: 59 | Source: Ambulatory Visit | Attending: Urology | Admitting: Urology

## 2018-10-05 ENCOUNTER — Ambulatory Visit (HOSPITAL_COMMUNITY): Payer: 59

## 2018-10-05 ENCOUNTER — Ambulatory Visit (HOSPITAL_COMMUNITY): Payer: 59 | Admitting: Certified Registered Nurse Anesthetist

## 2018-10-05 ENCOUNTER — Other Ambulatory Visit: Payer: Self-pay | Admitting: Urology

## 2018-10-05 DIAGNOSIS — T83122A Displacement of urinary stent, initial encounter: Secondary | ICD-10-CM | POA: Diagnosis present

## 2018-10-05 DIAGNOSIS — R112 Nausea with vomiting, unspecified: Secondary | ICD-10-CM | POA: Diagnosis not present

## 2018-10-05 DIAGNOSIS — F17200 Nicotine dependence, unspecified, uncomplicated: Secondary | ICD-10-CM | POA: Diagnosis not present

## 2018-10-05 DIAGNOSIS — Z880 Allergy status to penicillin: Secondary | ICD-10-CM | POA: Diagnosis not present

## 2018-10-05 DIAGNOSIS — Z841 Family history of disorders of kidney and ureter: Secondary | ICD-10-CM | POA: Diagnosis not present

## 2018-10-05 DIAGNOSIS — Z833 Family history of diabetes mellitus: Secondary | ICD-10-CM | POA: Insufficient documentation

## 2018-10-05 DIAGNOSIS — Z9889 Other specified postprocedural states: Secondary | ICD-10-CM | POA: Insufficient documentation

## 2018-10-05 DIAGNOSIS — Z9049 Acquired absence of other specified parts of digestive tract: Secondary | ICD-10-CM | POA: Insufficient documentation

## 2018-10-05 DIAGNOSIS — Z79899 Other long term (current) drug therapy: Secondary | ICD-10-CM | POA: Insufficient documentation

## 2018-10-05 DIAGNOSIS — X58XXXA Exposure to other specified factors, initial encounter: Secondary | ICD-10-CM | POA: Diagnosis not present

## 2018-10-05 DIAGNOSIS — T8384XA Pain from genitourinary prosthetic devices, implants and grafts, initial encounter: Secondary | ICD-10-CM | POA: Diagnosis not present

## 2018-10-05 DIAGNOSIS — Z8249 Family history of ischemic heart disease and other diseases of the circulatory system: Secondary | ICD-10-CM | POA: Diagnosis not present

## 2018-10-05 HISTORY — PX: CYSTOSCOPY WITH RETROGRADE PYELOGRAM, URETEROSCOPY AND STENT PLACEMENT: SHX5789

## 2018-10-05 SURGERY — CYSTOURETEROSCOPY, WITH RETROGRADE PYELOGRAM AND STENT INSERTION
Anesthesia: General | Laterality: Right

## 2018-10-05 MED ORDER — ONDANSETRON HCL 4 MG/2ML IJ SOLN
INTRAMUSCULAR | Status: DC | PRN
  Administered 2018-10-05: 4 mg via INTRAVENOUS

## 2018-10-05 MED ORDER — LIDOCAINE 2% (20 MG/ML) 5 ML SYRINGE
INTRAMUSCULAR | Status: DC | PRN
  Administered 2018-10-05: 80 mg via INTRAVENOUS

## 2018-10-05 MED ORDER — OXYCODONE HCL 5 MG PO TABS: 5.0000 mg | ORAL_TABLET | Freq: Once | ORAL | Status: DC | PRN

## 2018-10-05 MED ORDER — SCOPOLAMINE 1 MG/3DAYS TD PT72
MEDICATED_PATCH | TRANSDERMAL | Status: AC
  Administered 2018-10-05: 16:00:00 via TRANSDERMAL
  Filled 2018-10-05: qty 1

## 2018-10-05 MED ORDER — LIDOCAINE 2% (20 MG/ML) 5 ML SYRINGE
INTRAMUSCULAR | Status: AC
  Filled 2018-10-05: qty 10

## 2018-10-05 MED ORDER — KETOROLAC TROMETHAMINE 15 MG/ML IJ SOLN
INTRAMUSCULAR | Status: DC | PRN
  Administered 2018-10-05: 15 mg via INTRAVENOUS

## 2018-10-05 MED ORDER — ONDANSETRON HCL 4 MG/2ML IJ SOLN
INTRAMUSCULAR | Status: AC
  Filled 2018-10-05: qty 2

## 2018-10-05 MED ORDER — SODIUM CHLORIDE 0.9 % IR SOLN
Status: DC | PRN
  Administered 2018-10-05: 3000 mL

## 2018-10-05 MED ORDER — MIDAZOLAM HCL 2 MG/2ML IJ SOLN
INTRAMUSCULAR | Status: AC
  Filled 2018-10-05: qty 2

## 2018-10-05 MED ORDER — HYDROMORPHONE HCL 1 MG/ML IJ SOLN: 0.2500 mg | INTRAMUSCULAR | Status: DC | PRN

## 2018-10-05 MED ORDER — INDIGOTINDISULFONATE SODIUM 8 MG/ML IJ SOLN
INTRAMUSCULAR | Status: AC
  Filled 2018-10-05: qty 5

## 2018-10-05 MED ORDER — FENTANYL CITRATE (PF) 100 MCG/2ML IJ SOLN
INTRAMUSCULAR | Status: AC
  Filled 2018-10-05: qty 2

## 2018-10-05 MED ORDER — CIPROFLOXACIN IN D5W 400 MG/200ML IV SOLN
400.0000 mg | INTRAVENOUS | Status: AC
  Administered 2018-10-05: 400 mg via INTRAVENOUS
  Filled 2018-10-05: qty 200

## 2018-10-05 MED ORDER — KETOROLAC TROMETHAMINE 30 MG/ML IJ SOLN
INTRAMUSCULAR | Status: AC
  Filled 2018-10-05: qty 1

## 2018-10-05 MED ORDER — SUCCINYLCHOLINE CHLORIDE 200 MG/10ML IV SOSY
PREFILLED_SYRINGE | INTRAVENOUS | Status: AC
  Filled 2018-10-05: qty 10

## 2018-10-05 MED ORDER — BELLADONNA ALKALOIDS-OPIUM 16.2-30 MG RE SUPP
RECTAL | Status: AC
  Filled 2018-10-05: qty 1

## 2018-10-05 MED ORDER — IOHEXOL 300 MG/ML  SOLN
INTRAMUSCULAR | Status: DC | PRN
  Administered 2018-10-05: 17:00:00 1 mL via URETHRAL

## 2018-10-05 MED ORDER — FENTANYL CITRATE (PF) 100 MCG/2ML IJ SOLN
INTRAMUSCULAR | Status: DC | PRN
  Administered 2018-10-05: 25 ug via INTRAVENOUS
  Administered 2018-10-05: 50 ug via INTRAVENOUS
  Administered 2018-10-05: 25 ug via INTRAVENOUS

## 2018-10-05 MED ORDER — DEXAMETHASONE SODIUM PHOSPHATE 10 MG/ML IJ SOLN
INTRAMUSCULAR | Status: DC | PRN
  Administered 2018-10-05: 10 mg via INTRAVENOUS

## 2018-10-05 MED ORDER — PROMETHAZINE HCL 25 MG/ML IJ SOLN: 6.2500 mg | INTRAMUSCULAR | Status: DC | PRN

## 2018-10-05 MED ORDER — OXYCODONE HCL 5 MG/5ML PO SOLN: 5.0000 mg | Freq: Once | ORAL | Status: DC | PRN

## 2018-10-05 MED ORDER — SUCCINYLCHOLINE CHLORIDE 200 MG/10ML IV SOSY
PREFILLED_SYRINGE | INTRAVENOUS | Status: DC | PRN
  Administered 2018-10-05: 100 mg via INTRAVENOUS

## 2018-10-05 MED ORDER — LACTATED RINGERS IV SOLN: INTRAVENOUS | Status: DC

## 2018-10-05 MED ORDER — MIDAZOLAM HCL 5 MG/5ML IJ SOLN
INTRAMUSCULAR | Status: DC | PRN
  Administered 2018-10-05: 2 mg via INTRAVENOUS

## 2018-10-05 MED ORDER — DEXAMETHASONE SODIUM PHOSPHATE 10 MG/ML IJ SOLN
INTRAMUSCULAR | Status: AC
  Filled 2018-10-05: qty 1

## 2018-10-05 MED ORDER — PROPOFOL 10 MG/ML IV BOLUS
INTRAVENOUS | Status: DC | PRN
  Administered 2018-10-05: 120 mg via INTRAVENOUS

## 2018-10-05 MED ORDER — SODIUM CHLORIDE 0.9 % IR SOLN
Status: DC | PRN
  Administered 2018-10-05: 1000 mL via INTRAVESICAL

## 2018-10-05 MED ORDER — LACTATED RINGERS IV SOLN
INTRAVENOUS | Status: DC | PRN
  Administered 2018-10-05: 16:00:00 via INTRAVENOUS

## 2018-10-05 SURGICAL SUPPLY — 25 items
BAG URO CATCHER STRL LF (MISCELLANEOUS) ×3 IMPLANT
BASKET LASER NITINOL 1.9FR (BASKET) IMPLANT
BASKET ZERO TIP NITINOL 2.4FR (BASKET) IMPLANT
BSKT STON RTRVL 120 1.9FR (BASKET)
BSKT STON RTRVL ZERO TP 2.4FR (BASKET)
CATH INTERMIT  6FR 70CM (CATHETERS) IMPLANT
CATH URET 5FR 28IN CONE TIP (BALLOONS)
CATH URET 5FR 70CM CONE TIP (BALLOONS) IMPLANT
CLOTH BEACON ORANGE TIMEOUT ST (SAFETY) ×3 IMPLANT
COVER WAND RF STERILE (DRAPES) IMPLANT
EXTRACTOR STONE 1.7FRX115CM (UROLOGICAL SUPPLIES) IMPLANT
FIBER LASER FLEXIVA 365 (UROLOGICAL SUPPLIES) IMPLANT
FIBER LASER TRAC TIP (UROLOGICAL SUPPLIES) IMPLANT
FORCEPS BASKET TRICEP GRSP 2.4 (BASKET) ×2 IMPLANT
FORCEPS GRASP 3 PRONG 3.2FR (CUTTING FORCEPS) ×2 IMPLANT
GLOVE BIO SURGEON STRL SZ7.5 (GLOVE) ×3 IMPLANT
GOWN STRL REUS W/TWL XL LVL3 (GOWN DISPOSABLE) ×3 IMPLANT
GUIDEWIRE ANG ZIPWIRE 038X150 (WIRE) IMPLANT
GUIDEWIRE STR DUAL SENSOR (WIRE) ×3 IMPLANT
KIT TURNOVER KIT A (KITS) IMPLANT
MANIFOLD NEPTUNE II (INSTRUMENTS) ×3 IMPLANT
PACK CYSTO (CUSTOM PROCEDURE TRAY) ×3 IMPLANT
SHEATH URETERAL 12FRX28CM (UROLOGICAL SUPPLIES) IMPLANT
SHEATH URETERAL 12FRX35CM (MISCELLANEOUS) IMPLANT
TUBING UROLOGY SET (TUBING) ×5 IMPLANT

## 2018-10-05 NOTE — Discharge Instructions (Signed)

## 2018-10-05 NOTE — Op Note (Signed)
Operative Note  Preoperative diagnosis:  1.  Right retained ureteral stent   Postoperative diagnosis: 1.  Same  Procedure(s): 1.  Cystoscopy, right diagnostic ureteroscopy with foreign body extraction  Surgeon: Link Snuffer, MD  Assistants: None  Anesthesia: General  Complications: None immediate  EBL: Minimal  Specimens: 1.  None  Drains/Catheters: 1.  None  Intraoperative findings: 1.  Normal urethra and bladder 2.  These ureteral stent had migrated upward to the distal ureter approximately 4 cm from the ureteral orifice.  This was able to be removed.  Indication: 53 year old female status post right ureteroscopy with laser lithotripsy and ureteral stent placement was found to have a migrated ureteral stent today after she presented to the office for stent removal due to severe pain, nausea, vomiting.  She also had a low-grade temperature of 99.5 but urine culture 2 days ago was negative.  Cystoscopy in the office as well as KUB confirmed migration of the stent and therefore she was taken to the operating room for ureteroscopic removal.  Description of procedure:  The patient was identified and consent was obtained.  The patient was taken to the operating room and placed in the supine position.  The patient was placed under general anesthesia.  Perioperative antibiotics were administered.  The patient was placed in dorsal lithotomy.  Patient was prepped and draped in a standard sterile fashion and a timeout was performed.  A 21 French rigid cystoscope was advanced into the urethra and into the bladder.  Complete cystoscopy was performed with findings noted above.  The right ureter was cannulated with a sensor wire which was advanced up to the kidney under fluoroscopic guidance.  A semirigid ureteroscope was advanced alongside the wire.  A grasping instrument was passed through the scope and was able to grasp the stent and remove it.  I performed a diagnostic ureteroscopy after  this and there was some blood clot within the ureter that was removed ureteroscopically.  There was also a small stone remaining that was removed as well.  There was some ureteral inflammation at the level of prior stone impaction but the area was open.  Given her severe pain with the stent I decided to leave it out.  Therefore withdrew the scope along with the wire and this concluded the operation.  The patient tolerated the procedure well was stable postoperatively.  Plan: Return in 1 month with renal ultrasound and KUB

## 2018-10-05 NOTE — Anesthesia Postprocedure Evaluation (Signed)
Anesthesia Post Note  Patient: Tonya Bowman  Procedure(s) Performed: CYSTOSCOPY WITH RETROGRADE PYELOGRAM, URETEROSCOPY AND STENT REMOVAL (Right )     Patient location during evaluation: PACU Anesthesia Type: General Level of consciousness: awake and alert Pain management: pain level controlled Vital Signs Assessment: post-procedure vital signs reviewed and stable Respiratory status: spontaneous breathing, nonlabored ventilation, respiratory function stable and patient connected to nasal cannula oxygen Cardiovascular status: blood pressure returned to baseline and stable Postop Assessment: no apparent nausea or vomiting Anesthetic complications: no    Last Vitals:  Vitals:   10/05/18 1715 10/05/18 1730  BP: 129/90 128/84  Pulse: 71 68  Resp: 13 13  Temp: 36.7 C   SpO2: 100% 100%    Last Pain:  Vitals:   10/05/18 1730  TempSrc:   PainSc: 3                  Lynda Rainwater

## 2018-10-05 NOTE — Transfer of Care (Signed)
Immediate Anesthesia Transfer of Care Note  Patient: Tonya Bowman  Procedure(s) Performed: CYSTOSCOPY WITH RETROGRADE PYELOGRAM, URETEROSCOPY AND STENT REMOVAL (Right )  Patient Location: PACU  Anesthesia Type:General  Level of Consciousness: awake, alert  and patient cooperative  Airway & Oxygen Therapy: Patient Spontanous Breathing and Patient connected to face mask oxygen  Post-op Assessment: Report given to RN and Post -op Vital signs reviewed and stable  Post vital signs: Reviewed and stable  Last Vitals:  Vitals Value Taken Time  BP 133/88 10/05/2018  5:11 PM  Temp    Pulse 74 10/05/2018  5:13 PM  Resp 13 10/05/2018  5:13 PM  SpO2 100 % 10/05/2018  5:13 PM  Vitals shown include unvalidated device data.  Last Pain:  Vitals:   10/05/18 1600  TempSrc:   PainSc: 7       Patients Stated Pain Goal: 7 (44/[id number])  Complications: No apparent anesthesia complications

## 2018-10-05 NOTE — Anesthesia Preprocedure Evaluation (Signed)
Anesthesia Evaluation  Patient identified by MRN, date of birth, ID band Patient awake    Reviewed: Allergy & Precautions, NPO status , Patient's Chart, lab work & pertinent test results  History of Anesthesia Complications (+) PONV  Airway Mallampati: II  TM Distance: >3 FB Neck ROM: Full    Dental no notable dental hx.    Pulmonary neg pulmonary ROS,    Pulmonary exam normal breath sounds clear to auscultation       Cardiovascular negative cardio ROS Normal cardiovascular exam Rhythm:Regular Rate:Normal     Neuro/Psych negative neurological ROS  negative psych ROS   GI/Hepatic negative GI ROS, Neg liver ROS,   Endo/Other  negative endocrine ROS  Renal/GU negative Renal ROS  negative genitourinary   Musculoskeletal negative musculoskeletal ROS (+)   Abdominal   Peds negative pediatric ROS (+)  Hematology negative hematology ROS (+)   Anesthesia Other Findings   Reproductive/Obstetrics negative OB ROS                             Anesthesia Physical  Anesthesia Plan  ASA: II  Anesthesia Plan: General   Post-op Pain Management:    Induction: Intravenous  PONV Risk Score and Plan: 4 or greater and Ondansetron, Dexamethasone, Treatment may vary due to age or medical condition, Midazolam and Scopolamine patch - Pre-op  Airway Management Planned: LMA  Additional Equipment:   Intra-op Plan:   Post-operative Plan: Extubation in OR  Informed Consent: I have reviewed the patients History and Physical, chart, labs and discussed the procedure including the risks, benefits and alternatives for the proposed anesthesia with the patient or authorized representative who has indicated his/her understanding and acceptance.     Dental advisory given  Plan Discussed with: CRNA and Surgeon  Anesthesia Plan Comments:         Anesthesia Quick Evaluation

## 2018-10-05 NOTE — H&P (Signed)
CC/HPI: CC: Right ureteral calculus  HPI:  10/03/2018  Patient presents with severe right-sided flank pain, nausea, vomiting. She denies fever or chill. She comes from the emergency department where she underwent a CT scan of the abdomen and pelvis without contrast that revealed a 7 mm distal right ureteral calculus with right hydroureteronephrosis. She also had a punctate nonobstructive left stone in the kidney. This her first event. Despite medication, she continues to have severe nausea, vomiting. She is also having continued pain. She desires intervention tonight. In the emergency department, she had very mild leukocytosis of 11.7. Creatinine is 0.73. Urinalysis in the office reveals moderate bacteria (25-30), 0-5 WBCs, 20-40 RBCs. She had negative nitrite, trace leukocyte.   10/05/2018  Patient status post right ureteroscopy with laser lithotripsy and ureteral stent placement on 10/03/2018. She is having severe pain, nausea, vomiting. She is also having low-grade temperatures ranging from 99-100. Denies any chest pain, shortness of breath, cough. I started her on Bactrim yesterday. Urine culture was negative on 10/03/2018. Sounds like she is just not tolerating the stent well.     ALLERGIES: penicillin Penicillins    MEDICATIONS: Bactrim Ds 800 mg-160 mg tablet 1 tablet PO BID  Bactrim Ds 800 mg-160 mg tablet 1 tablet PO BID  Compazine 10 mg tablet 1 tablet PO Q 8 H     GU PSH: Hysterectomy Unilat SO - 2011      PSH Notes: Appendectomy, Gallbladder Surgery, Hysterectomy   NON-GU PSH: Appendectomy - 2011 Appendectomy (laparoscopic) Cholecystectomy (laparoscopic)    GU PMH: Renal colic - 0/63/0160 Ureteral calculus - 10/03/2018 Dysuria, Dysuria - 2014 Oth GU systems Signs/Symptoms, Bladder pain - 2014      PMH Notes:  1898-07-05 00:00:00 - Note: Normal Routine History And Physical Adult   NON-GU PMH: Nausea with vomiting, unspecified - 10/03/2018 Personal history of other diseases  of the digestive system, History of esophageal reflux - 2014    FAMILY HISTORY: Depression - Father, Sister Diabetes - Mother Family Health Status Number - Runs In Family Hypertension - Father, Sister, Brother, Mother nephrolithiasis - Brother, Sister   SOCIAL HISTORY: No Social History     Notes: Marital History - Single, Caffeine Use, Tobacco Use, Occupation:, Alcohol Use   REVIEW OF SYSTEMS:    GU Review Female:   Patient denies frequent urination, hard to postpone urination, burning /pain with urination, get up at night to urinate, leakage of urine, stream starts and stops, trouble starting your stream, have to strain to urinate, and being pregnant.  Gastrointestinal (Upper):   Patient denies nausea, vomiting, and indigestion/ heartburn.  Gastrointestinal (Lower):   Patient denies diarrhea and constipation.  Constitutional:   Patient denies night sweats, fatigue, weight loss, and fever.  Skin:   Patient denies skin rash/ lesion and itching.  Eyes:   Patient denies blurred vision and double vision.  Ears/ Nose/ Throat:   Patient denies sore throat and sinus problems.  Hematologic/Lymphatic:   Patient denies swollen glands and easy bruising.  Cardiovascular:   Patient denies leg swelling and chest pains.  Respiratory:   Patient denies cough and shortness of breath.  Endocrine:   Patient denies excessive thirst.  Musculoskeletal:   Patient denies back pain and joint pain.  Neurological:   Patient denies headaches and dizziness.  Psychologic:   Patient denies depression and anxiety.   VITAL SIGNS:      10/05/2018 02:09 PM  BP 135/90 mmHg  Heart Rate 116 /min  Temperature 99.0 F / 37.2  C   MULTI-SYSTEM PHYSICAL EXAMINATION:    Constitutional: Well-nourished. No physical deformities. Appears uncomfortable secondary to renal colic and nausea  Respiratory: No labored breathing, no use of accessory muscles.   Cardiovascular: Normal temperature, adequate perfusion of extremities   Skin: No paleness, no jaundice  Neurologic / Psychiatric: Oriented to time, oriented to place, oriented to person. No depression, no anxiety, no agitation.  Gastrointestinal: No mass, no tenderness, no rigidity, non obese abdomen.   Eyes: Normal conjunctivae. Normal eyelids.  Musculoskeletal: Normal gait and station of head and neck.     PAST DATA REVIEWED:  Source Of History:  Patient   PROCEDURES:         Flexible Cystoscopy - 52000  Risks, benefits, and some of the potential complications of the procedure were discussed at length with the patient including infection, bleeding, voiding discomfort, urinary retention, fever, chills, sepsis, and others. All questions were answered. Informed consent was obtained. Antibiotic prophylaxis was given. Sterile technique and intraurethral analgesia were used.  Meatus:  Normal size. Normal location. Normal condition.  Urethra:  Normal urethra  Ureteral Orifices:  Normal location. Normal size. Normal shape. Stent is not visible  Bladder:  No trabeculation. No tumors. Normal mucosa. No stones.      The lower urinary tract was carefully examined. The procedure was well-tolerated and without complications. Antibiotic instructions were given. Instructions were given to call the office immediately for bloody urine, difficulty urinating, urinary retention, painful or frequent urination, fever, chills, nausea, vomiting or other illness. The patient stated that she understood these instructions and would comply with them.          KUB - [id number]  A single view of the abdomen is obtained.  Stent is present and appears to have migrated up.      Patient confirmed No Neulasta OnPro Device.           Urinalysis w/Scope Dipstick Dipstick Cont'd Micro  Color: Red Bilirubin: Neg mg/dL WBC/hpf: 6 - 10/hpf  Appearance: Cloudy Ketones: Neg mg/dL RBC/hpf: >60/hpf  Specific Gravity: 1.020 Blood: 3+ ery/uL Bacteria: Rare (0-9/hpf)  pH: 7.0 Protein: 2+ mg/dL  Cystals: NS (Not Seen)  Glucose: Neg mg/dL Urobilinogen: 0.2 mg/dL Casts: NS (Not Seen)    Nitrites: Neg Trichomonas: Not Present    Leukocyte Esterase: 2+ leu/uL Mucous: Not Present      Epithelial Cells: NS (Not Seen)      Yeast: NS (Not Seen)      Sperm: Not Present         Phenergan 25mg  - J2550, 19417 Qty: 25 Adm. By: Alcide Goodness  Unit: mg Lot No   Route: IM Exp. Date   Freq: None Mfgr.:   Site: Left Hip   ASSESSMENT:      ICD-10 Details  1 GU:   Displacement of indwelling ureteral stent, initial enc - E08.144Y   3   Renal colic - J85   2 NON-GU:   Nausea with vomiting, unspecified - R11.2    PLAN:           Orders Labs Urine Culture  X-Rays: KUB          Schedule         Document Letter(s):  Created for Patient: Clinical Summary         Notes:   Stent has migrated up. She is having severe pain, nausea, vomiting. We will proceed with stent removal in the operating room today   CC: Kathyrn Lass, M.D.  Next Appointment:      Next Appointment: 10/11/2018 12:15 PM    Appointment Type: Postoperative Appointment    Location: [facility] [phone]    Provider: Link Snuffer, III, M.D.    Reason for Visit: POST OP CYSTO STENT    Signed by Link Snuffer, III, M.D. on 10/05/18 at 3:02 PM (EDT

## 2018-10-05 NOTE — Anesthesia Procedure Notes (Addendum)
Procedure Name: Intubation Date/Time: 10/05/2018 4:27 PM Performed by: West Pugh, CRNA Pre-anesthesia Checklist: Patient identified, Emergency Drugs available, Suction available, Patient being monitored and Timeout performed Patient Re-evaluated:Patient Re-evaluated prior to induction Oxygen Delivery Method: Circle system utilized Preoxygenation: Pre-oxygenation with 100% oxygen Induction Type: IV induction and Rapid sequence Laryngoscope Size: Mac and 3 Grade View: Grade II Tube type: Oral Tube size: 7.0 mm Number of attempts: 1 Airway Equipment and Method: Stylet Placement Confirmation: ETT inserted through vocal cords under direct vision,  positive ETCO2,  CO2 detector and breath sounds checked- equal and bilateral Secured at: 22 cm Tube secured with: Tape Dental Injury: Teeth and Oropharynx as per pre-operative assessment

## 2018-10-06 ENCOUNTER — Encounter (HOSPITAL_COMMUNITY): Payer: Self-pay | Admitting: Urology

## 2018-10-07 ENCOUNTER — Other Ambulatory Visit: Payer: Self-pay

## 2018-10-07 ENCOUNTER — Emergency Department (HOSPITAL_COMMUNITY): Payer: 59

## 2018-10-07 ENCOUNTER — Encounter (HOSPITAL_COMMUNITY): Payer: Self-pay | Admitting: Emergency Medicine

## 2018-10-07 ENCOUNTER — Emergency Department (HOSPITAL_COMMUNITY)
Admission: EM | Admit: 2018-10-07 | Discharge: 2018-10-07 | Disposition: A | Discharge status: Home / Self Care | Payer: 59 | Attending: Emergency Medicine | Admitting: Emergency Medicine

## 2018-10-07 DIAGNOSIS — R109 Unspecified abdominal pain: Secondary | ICD-10-CM

## 2018-10-07 DIAGNOSIS — N1330 Unspecified hydronephrosis: Secondary | ICD-10-CM | POA: Diagnosis not present

## 2018-10-07 DIAGNOSIS — Z79899 Other long term (current) drug therapy: Secondary | ICD-10-CM | POA: Diagnosis not present

## 2018-10-07 LAB — CBC
HCT: 41.7 % (ref 36.0–46.0)
Hemoglobin: 14 g/dL (ref 12.0–15.0)
MCH: 30.9 pg (ref 26.0–34.0)
MCHC: 33.6 g/dL (ref 30.0–36.0)
MCV: 92.1 fL (ref 80.0–100.0)
Platelets: 288 10*3/uL (ref 150–400)
RBC: 4.53 MIL/uL (ref 3.87–5.11)
RDW: 12.5 % (ref 11.5–15.5)
WBC: 13.9 10*3/uL — ABNORMAL HIGH (ref 4.0–10.5)
nRBC: 0 % (ref 0.0–0.2)

## 2018-10-07 LAB — URINALYSIS, ROUTINE W REFLEX MICROSCOPIC
Bilirubin Urine: NEGATIVE
Glucose, UA: NEGATIVE mg/dL
Ketones, ur: NEGATIVE mg/dL
Nitrite: NEGATIVE
Protein, ur: 100 mg/dL — AB
RBC / HPF: >50 RBC/hpf — ABNORMAL HIGH (ref 0–5)
Specific Gravity, Urine: 1.017 (ref 1.005–1.030)
pH: 7 (ref 5.0–8.0)

## 2018-10-07 LAB — COMPREHENSIVE METABOLIC PANEL
ALT: 35 U/L (ref 0–44)
AST: 21 U/L (ref 15–41)
Albumin: 3.9 g/dL (ref 3.5–5.0)
Alkaline Phosphatase: 81 U/L (ref 38–126)
Anion gap: 10 (ref 5–15)
BUN: 20 mg/dL (ref 6–20)
CO2: 24 mmol/L (ref 22–32)
Calcium: 9.1 mg/dL (ref 8.9–10.3)
Chloride: 108 mmol/L (ref 98–111)
Creatinine, Ser: 0.92 mg/dL (ref 0.44–1.00)
GFR calc Af Amer: >60 mL/min (ref >60)
GFR calc non Af Amer: >60 mL/min (ref >60)
Glucose, Bld: 98 mg/dL (ref 70–99)
Potassium: 3.3 mmol/L — ABNORMAL LOW (ref 3.5–5.1)
Sodium: 142 mmol/L (ref 135–145)
Total Bilirubin: 0.7 mg/dL (ref 0.3–1.2)
Total Protein: 7.7 g/dL (ref 6.5–8.1)

## 2018-10-07 LAB — LIPASE, BLOOD: Lipase: 41 U/L (ref 11–51)

## 2018-10-07 MED ORDER — CEPHALEXIN 500 MG PO CAPS: 500.0000 mg | ORAL_CAPSULE | Freq: Two times a day (BID) | ORAL | 0 refills | Status: AC

## 2018-10-07 MED ORDER — SODIUM CHLORIDE 0.9 % IV BOLUS
500.0000 mL | Freq: Once | INTRAVENOUS | Status: AC
  Administered 2018-10-07: 13:00:00 500 mL via INTRAVENOUS

## 2018-10-07 MED ORDER — FAMOTIDINE 20 MG PO TABS
20.0000 mg | ORAL_TABLET | Freq: Once | ORAL | Status: AC
  Administered 2018-10-07: 20 mg via ORAL
  Filled 2018-10-07: qty 1

## 2018-10-07 MED ORDER — POTASSIUM CHLORIDE CRYS ER 20 MEQ PO TBCR
40.0000 meq | EXTENDED_RELEASE_TABLET | Freq: Once | ORAL | Status: AC
  Administered 2018-10-07: 40 meq via ORAL
  Filled 2018-10-07: qty 2

## 2018-10-07 MED ORDER — DIAZEPAM 2 MG PO TABS: 2.0000 mg | ORAL_TABLET | Freq: Two times a day (BID) | ORAL | 0 refills | Status: AC

## 2018-10-07 MED ORDER — DIAZEPAM 2 MG PO TABS: 2.0000 mg | ORAL_TABLET | Freq: Once | ORAL | Status: DC

## 2018-10-07 MED ORDER — HYDROMORPHONE HCL 1 MG/ML IJ SOLN
1.0000 mg | Freq: Once | INTRAMUSCULAR | Status: AC
  Administered 2018-10-07: 10:00:00 1 mg via INTRAVENOUS
  Filled 2018-10-07: qty 1

## 2018-10-07 MED ORDER — KETOROLAC TROMETHAMINE 30 MG/ML IJ SOLN
30.0000 mg | Freq: Once | INTRAMUSCULAR | Status: AC
  Administered 2018-10-07: 30 mg via INTRAVENOUS
  Filled 2018-10-07: qty 1

## 2018-10-07 MED ORDER — KETOROLAC TROMETHAMINE 10 MG PO TABS: 10.0000 mg | ORAL_TABLET | Freq: Four times a day (QID) | ORAL | 0 refills | Status: DC | PRN

## 2018-10-07 MED ORDER — SODIUM CHLORIDE 0.9 % IV SOLN
1.0000 g | Freq: Once | INTRAVENOUS | Status: AC
  Administered 2018-10-07: 1 g via INTRAVENOUS
  Filled 2018-10-07: qty 10

## 2018-10-07 MED ORDER — ONDANSETRON HCL 4 MG/2ML IJ SOLN
4.0000 mg | Freq: Once | INTRAMUSCULAR | Status: AC
  Administered 2018-10-07: 4 mg via INTRAVENOUS
  Filled 2018-10-07: qty 2

## 2018-10-07 MED ORDER — OXYCODONE HCL 5 MG PO TABS: 5.0000 mg | ORAL_TABLET | Freq: Four times a day (QID) | ORAL | 0 refills | Status: AC | PRN

## 2018-10-07 MED ORDER — URIBEL 118 MG PO CAPS: 1.0000 | ORAL_CAPSULE | Freq: Four times a day (QID) | ORAL | 0 refills | Status: AC

## 2018-10-07 MED ORDER — OXYCODONE HCL 5 MG PO CAPS: 5.0000 mg | ORAL_CAPSULE | Freq: Four times a day (QID) | ORAL | 0 refills | Status: DC | PRN

## 2018-10-07 MED ORDER — DIAZEPAM 5 MG PO TABS
5.0000 mg | ORAL_TABLET | Freq: Once | ORAL | Status: AC
  Administered 2018-10-07: 13:00:00 5 mg via ORAL
  Filled 2018-10-07: qty 1

## 2018-10-07 MED ORDER — SODIUM CHLORIDE 0.9% FLUSH
3.0000 mL | Freq: Once | INTRAVENOUS | Status: AC
  Administered 2018-10-07: 3 mL via INTRAVENOUS

## 2018-10-07 NOTE — ED Notes (Signed)
PT unable to provide urine sample at time. Will info staff when able to provide

## 2018-10-07 NOTE — ED Triage Notes (Signed)
Patient here from home with complaints of right side flank pain radiating around to abd. Reports being seen recently for same. Denies trouble urinating. No nausea/vomiting.

## 2018-10-07 NOTE — ED Provider Notes (Signed)
[place] DEPT Provider Note   CSN: [id number] Arrival date & time: 10/07/18  8527    History   Chief Complaint Chief Complaint  Patient presents with   Flank Pain   Abdominal Pain    HPI Tonya Bowman is a 53 y.o. female.     HPI  Patient is a 53 year old female with a history of nephrolithiasis requiring stent placement, and abdominal surgical history significant for appendectomy and cholecystectomy presenting for ongoing right flank pain.  Patient reports that she was diagnosed with a 7 mm right ureteric stone at the [facility] on 10-03-2018 and stent was placed.  She reports that she followed up with Dr. Gloriann Loan at [facility] urology in the office on 10-05-2018, and stent was removed due to stone passage during previous procedure.  Patient reports that she is continued to pass clots of blood and have constant right flank pain.  She also reports dysuria but denies urgency or frequency.  Patient reports that she vomited once 2 days ago but has not had any vomiting since.  She reports that she had a T-max at home of 100.1 on the day of stent placement but is been afebrile ever since.  Patient denies any upper respiratory respiratory symptoms.  She has been taking Norco around-the-clock for her symptoms without full relief.  Past Medical History:  Diagnosis Date   Complication of anesthesia    History of kidney stones    PONV (postoperative nausea and vomiting)     There are no active problems to display for this patient.   Past Surgical History:  Procedure Laterality Date   ABDOMINAL HYSTERECTOMY     APPENDECTOMY     CHOLECYSTECTOMY     CYSTOSCOPY WITH RETROGRADE PYELOGRAM, URETEROSCOPY AND STENT PLACEMENT Right 10/05/2018   Procedure: CYSTOSCOPY WITH RETROGRADE PYELOGRAM, URETEROSCOPY AND STENT REMOVAL;  Surgeon: Lucas Mallow, MD;  Location: [facility];  Service: Urology;  Laterality: Right;   CYSTOSCOPY/URETEROSCOPY/HOLMIUM LASER/STENT  PLACEMENT Right 10/03/2018   Procedure: CYSTOSCOPY/URETEROSCOPY/HOLMIUM LASER/STENT PLACEMENT;  Surgeon: Lucas Mallow, MD;  Location: [facility];  Service: Urology;  Laterality: Right;     OB History   No obstetric history on file.      Home Medications    Prior to Admission medications   Medication Sig Start Date End Date Taking? Authorizing Provider  HYDROcodone-acetaminophen (NORCO/VICODIN) 5-325 MG tablet Take 1 tablet by mouth every 4 (four) hours as needed for moderate pain. 10/03/18 10/03/19 Yes Marton Redwood III, MD  ibuprofen (ADVIL,MOTRIN) 200 MG tablet Take 200 mg by mouth every 6 (six) hours as needed.   Yes [provider]  prochlorperazine (COMPAZINE) 10 MG tablet Take 10 mg by mouth every 6 (six) hours as needed for nausea or vomiting.  10/04/18  Yes [provider]  valACYclovir (VALTREX) 500 MG tablet Take 1 tablet by mouth daily as needed. 07/22/18  Yes [provider]  ketorolac (TORADOL) 10 MG tablet Take 1 tablet (10 mg total) by mouth every 6 (six) hours as needed for moderate pain. Patient not taking: Reported on 10/07/2018 10/03/18   Long, Wonda Olds, MD  ondansetron (ZOFRAN) 4 MG tablet Take 1 tablet (4 mg total) by mouth every 8 (eight) hours as needed for nausea or vomiting. Patient not taking: Reported on 10/07/2018 10/03/18   Marton Redwood III, MD  potassium chloride SA (K-DUR,KLOR-CON) 20 MEQ tablet Take 2 tablets (40 mEq total) by mouth daily for 5 days. Patient not taking: Reported  on 10/07/2018 10/03/18 10/08/18  Margette Fast, MD  tamsulosin (FLOMAX) 0.4 MG CAPS capsule Take 1 capsule (0.4 mg total) by mouth daily. Patient not taking: Reported on 10/07/2018 10/03/18   Lucas Mallow, MD    Family History No family history on file.  Social History Social History   Tobacco Use   Smoking status: Never Smoker   Smokeless tobacco: Never Used  Substance Use Topics   Alcohol use: Not Currently   Drug use: Not Currently      Allergies   Penicillins   Review of Systems Review of Systems  Constitutional: Negative for chills and fever.  HENT: Negative for congestion and sore throat.   Eyes: Negative for visual disturbance.  Respiratory: Negative for cough, chest tightness and shortness of breath.   Cardiovascular: Negative for chest pain, palpitations and leg swelling.  Gastrointestinal: Positive for abdominal pain. Negative for nausea and vomiting.  Genitourinary: Positive for dysuria, flank pain and hematuria. Negative for difficulty urinating.  Musculoskeletal: Negative for back pain and myalgias.  Skin: Negative for rash.  Neurological: Negative for dizziness, syncope, light-headedness and headaches.     Physical Exam Updated Vital Signs BP (!) 171/104 (BP Location: Right Arm)    Pulse 84    Temp 98.8 F (37.1 C) (Oral)    Resp 20    SpO2 97%   Physical Exam Vitals signs and nursing note reviewed.  Constitutional:      General: She is not in acute distress.    Appearance: She is well-developed.  HENT:     Head: Normocephalic and atraumatic.  Eyes:     Conjunctiva/sclera: Conjunctivae normal.     Pupils: Pupils are equal, round, and reactive to light.  Neck:     Musculoskeletal: Normal range of motion and neck supple.  Cardiovascular:     Rate and Rhythm: Normal rate and regular rhythm.     Heart sounds: S1 normal and S2 normal. No murmur.  Pulmonary:     Effort: Pulmonary effort is normal.     Breath sounds: Normal breath sounds. No wheezing or rales.  Abdominal:     General: Bowel sounds are normal. There is no distension.     Palpations: Abdomen is soft.     Tenderness: There is abdominal tenderness in the right lower quadrant. There is right CVA tenderness. There is no guarding.  Musculoskeletal: Normal range of motion.        General: No deformity.  Lymphadenopathy:     Cervical: No cervical adenopathy.  Skin:    General: Skin is warm and dry.     Findings: No erythema or  rash.  Neurological:     Mental Status: She is alert.     Comments: Cranial nerves grossly intact. Patient moves extremities symmetrically and with good coordination.  Psychiatric:        Behavior: Behavior normal.        Thought Content: Thought content normal.        Judgment: Judgment normal.      ED Treatments / Results  Labs (all labs ordered are listed, but only abnormal results are displayed) Labs Reviewed  COMPREHENSIVE METABOLIC PANEL - Abnormal; Notable for the following components:      Result Value   Potassium 3.3 (*)    All other components within normal limits  CBC - Abnormal; Notable for the following components:   WBC 13.9 (*)    All other components within normal limits  URINALYSIS, ROUTINE W REFLEX  MICROSCOPIC - Abnormal; Notable for the following components:   APPearance HAZY (*)    Hgb urine dipstick LARGE (*)    Protein, ur 100 (*)    Leukocytes,Ua SMALL (*)    RBC / HPF >50 (*)    Bacteria, UA RARE (*)    All other components within normal limits  URINE CULTURE  LIPASE, BLOOD    EKG None  Radiology Dg C-arm 1-60 Min-no Report  Result Date: 10/05/2018 Fluoroscopy was utilized by the requesting physician.  No radiographic interpretation.   Ct Renal Stone Study  Result Date: 10/07/2018 CLINICAL DATA:  Patient here from home with complaints of right side flank pain radiating around to abd. Reports being seen recently for same. Denies trouble urinating. No nausea/vomiting. Surgery-gb, appy, hysterectomy EXAM: CT ABDOMEN AND PELVIS WITHOUT CONTRAST TECHNIQUE: Multidetector CT imaging of the abdomen and pelvis was performed following the standard protocol without IV contrast. COMPARISON:  Portable ureteral stent images from 10/05/2018. CT, 10/03/2018. FINDINGS: Lower chest: Linear lung base atelectasis.  No acute findings. Hepatobiliary: Normal liver. Status post cholecystectomy. Mild chronic dilation of common bile duct with normal distal tapering.  Pancreas: Unremarkable. No pancreatic ductal dilatation or surrounding inflammatory changes. Spleen: Normal in size without focal abnormality. Adrenals/Urinary Tract: No adrenal masses. Moderate right hydronephrosis. Dilation of the right ureter, which is seen all the way to the bladder. There is a calcification adjacent to the distal right ureter just below the pelvic brim. This appears to be atherosclerotic calcification in the internal iliac artery, and was present on the prior CT. The 5 mm stone seen in the distal ureter on the most recent prior CT is no longer present. There is right renal swelling with minor perinephric stranding. No right renal masses or intrarenal stones. Left kidney normal in size and position. Tiny nonobstructing stone projects in the anterior midpole. No left hydronephrosis. Normal left ureter. Bladder mildly distended.  No bladder mass or stone. Stomach/Bowel: Stomach is unremarkable. Small bowel and colon are normal in caliber. No wall thickening or inflammation. Vascular/Lymphatic: No significant vascular findings are present. No enlarged abdominal or pelvic lymph nodes. Reproductive: Status post hysterectomy. No adnexal masses. Other: No abdominal wall hernia or abnormality. No abdominopelvic ascites. Musculoskeletal: No acute abnormality.  No bone lesion. IMPRESSION: 1. Moderate right hydronephrosis with dilation of the right ureter. No ureteral stone. The 5 mm stone noted in the distal ureter on the prior CT is no longer present. Patient had a right ureteral stent removed on 10/05/2018. Current hydronephrosis and hydroureter may be residual. Given the current symptoms, however, suspect either a non radiopaque obstruction in the distal right ureter or pyelonephritis. 2. No other acute abnormalities. Electronically Signed   By: Lajean Manes M.D.   On: 10/07/2018 11:52    Procedures Procedures (including critical care time)  Medications Ordered in ED Medications  potassium  chloride SA (K-DUR,KLOR-CON) CR tablet 40 mEq (has no administration in time range)  sodium chloride flush (NS) 0.9 % injection 3 mL (3 mLs Intravenous Given 10/07/18 0948)  HYDROmorphone (DILAUDID) injection 1 mg (1 mg Intravenous Given 10/07/18 1000)  ondansetron (ZOFRAN) injection 4 mg (4 mg Intravenous Given 10/07/18 0959)     Initial Impression / Assessment and Plan / ED Course  I have reviewed the triage vital signs and the nursing notes.  Pertinent labs & imaging results that were available during my care of the patient were reviewed by me and considered in my medical decision making (see chart for details).  Clinical Course as of Oct 06 1504  Sat Oct 07, 2018  1054 WBC(!): 13.9 [AM]  1117 Spoke with Dr. Natividad Brood of [facility].  Recommends CT renal stone study at this time.  Reviewed labs with Dr. Natividad Brood and agrees that patient's urinalysis is not significant for infection and likely not the cause. Appreciate his involvement.    [AM]  1214 Again discussed with Dr. Natividad Brood who recommends Toradol, valium, and Pyelo treatment empirically while reviewing scan.    [AM]  1229 Pt reports improving pain.    [AM]  7341 Pt reporting pain is 2/10 currently. Will discuss with Dr. Natividad Brood and see if pt can be d/c'ed. Pt asking about Uribel. Will discuss with Dr. Natividad Brood.    [AM]    Clinical Course User Index [AM] Albesa Seen, PA-C       Patient nontoxic-appearing, afebrile, and hemodynamically stable.  Patient with difficulty obtaining pain control status post 2 days from stent removal.  Differential diagnosis includes recurrent stone, pyelonephritis, ureteric clot.  Low suspicion for intra-abdominal cause as patient has cholecystectomy and appendectomy.  Also low suspicion for pulmonary embolism, as patient is not having any chest pain or shortness of breath.  Aside from recent stent procedure, no other high risk factors for VTE.   Repeat renal stone study obtained which demonstrates  continued hydronephrosis and perinephric stranding.  Effective diagnosis includes recurrent stone versus pyelonephritis.  Dr. Natividad Brood also questioning clot within the ureter.  Recommends pain control with oxycodone, Toradol tablets, diazepam, and Keflex.  Patient additionally requesting Uribel tablets.  Dr. Natividad Brood feels this is reasonable.  Pain well controlled here in the emergency department with interventions.  Patient is tolerating p.o. and in no acute distress.  Stable for discharge.   Patient to follow-up with urology.  Return precautions given for any fevers, worsening pain or intractable nausea or vomiting.  Patient is in understanding and agrees with the plan of care.  Final Clinical Impressions(s) / ED Diagnoses   Final diagnoses:  Hydronephrosis, unspecified hydronephrosis type  Flank pain    ED Discharge Orders         Ordered    oxycodone (OXY-IR) 5 MG capsule  Every 6 hours PRN     10/07/18 1522    cephALEXin (KEFLEX) 500 MG capsule  2 times daily     10/07/18 1522    diazepam (VALIUM) 2 MG tablet  2 times daily     10/07/18 1522    Meth-Hyo-M Bl-Na Phos-Ph Sal (URIBEL) 118 MG CAPS  4 times daily     10/07/18 1522           Tamala Julian 10/07/18 1531    Blanchie Dessert, MD 10/08/18 1117

## 2018-10-07 NOTE — Discharge Instructions (Addendum)
Please read and follow all provided instructions.  Your diagnoses today include:  1. Hydronephrosis, unspecified hydronephrosis type   2. Flank pain     Tests performed today include: Urine test that showed blood in your urine and no infection CT scan showed persistent fluid in the tube running from kidney to bladder. Blood test that showed normal kidney function Vital signs. See below for your results today.   Medications prescribed:   Take any prescribed medications only as directed.  You have been prescribed Oxycodone for pain. This is an opioid pain medication. You may take this medication every 4-6 hours as needed for pain. Only take this medication if you need it for breakthrough pain. You may combine this medicine with Toradol, a non-steroidal anti-inflammatory drug (NSAID) every 6 hours, so you are getting something for pain relief every 3 hours. This should already be at your pharmacy.  Do not combine this medication with alcohol.  Please be advised to avoid driving or operating heavy machinery while taking this medication, as it may make you drowsy or impair judgment.   You prescribed Valium.  Please take this medicine twice daily.  This can make you sleepy, so please do not drive, drink alcohol, operate machinery while taking this medication.  This is to help relax the ureter.  Please take all of your antibiotics until finished.  You will take Keflex twice daily. You may develop abdominal discomfort or nausea from the antibiotic. If this occurs, you may take it with food. Some patients also get diarrhea with antibiotics. You may help offset this with probiotics which you can buy or get in yogurt. Do not eat or take the probiotics until 2 hours after your antibiotic. Some women develop vaginal yeast infections after antibiotics. If you develop unusual vaginal discharge after being on this medication, please see your primary care provider.   Some people develop allergies to  antibiotics. Symptoms of antibiotic allergy can be mild and include a flat rash and itching. They can also be more serious and include:  ?Hives - Hives are raised, red patches of skin that are usually very itchy.  ?Lip or tongue swelling  ?Trouble swallowing or breathing  ?Blistering of the skin or mouth.  If you have any of these serious symptoms, please seek emergency medical care immediately.  Please take the Uribel up to 4 times daily.  Please remain well-hydrated and drink lots of water after taking this medication.  Home care instructions:  Follow any educational materials contained in this packet.  Please double your fluid intake for the next several days. Strain your urine and save any stones that may pass.   BE VERY CAREFUL not to take multiple medicines containing Tylenol (also called acetaminophen). Doing so can lead to an overdose which can damage your liver and cause liver failure and possibly death.   Follow-up instructions: Please follow-up with your urologist or the urologist referral (provided on front page) in the next 1 week for further evaluation of your symptoms.  If you need to return to the Emergency Department, go to [facility] and not [facility]. The urologists are located at [facility] and can better care for you at this location.  Return instructions:  If you need to return to the Emergency Department, go to [facility] and not [facility]. The urologists are located at [facility] and can better care for you at this location.  Please return to the Emergency Department if you  experience worsening symptoms.  Please return if you develop fever or uncontrolled pain or vomiting. Please return if you have any other emergent concerns.  Additional Information:  Your vital signs today were: BP 120/87    Pulse 74    Temp 98.2 F (36.8 C) (Oral)    Resp 20    SpO2 92%  If your blood pressure (BP) was elevated above 135/85  this visit, please have this repeated by your doctor within one month. --------------

## 2018-10-09 LAB — URINE CULTURE: Culture: NO GROWTH

## 2019-03-07 ENCOUNTER — Encounter (INDEPENDENT_AMBULATORY_CARE_PROVIDER_SITE_OTHER): Payer: Self-pay | Admitting: *Deleted

## 2019-03-30 ENCOUNTER — Other Ambulatory Visit (INDEPENDENT_AMBULATORY_CARE_PROVIDER_SITE_OTHER): Payer: Self-pay | Admitting: *Deleted

## 2019-03-30 DIAGNOSIS — Z1211 Encounter for screening for malignant neoplasm of colon: Secondary | ICD-10-CM

## 2019-03-30 DIAGNOSIS — Z800 Family history of malignant neoplasm of digestive organs: Secondary | ICD-10-CM

## 2019-03-30 HISTORY — DX: Encounter for screening for malignant neoplasm of colon: Z12.11

## 2019-03-30 HISTORY — DX: Family history of malignant neoplasm of digestive organs: Z80.0

## 2019-05-08 ENCOUNTER — Other Ambulatory Visit (INDEPENDENT_AMBULATORY_CARE_PROVIDER_SITE_OTHER): Payer: Self-pay | Admitting: *Deleted

## 2019-05-08 ENCOUNTER — Encounter (INDEPENDENT_AMBULATORY_CARE_PROVIDER_SITE_OTHER): Payer: Self-pay | Admitting: *Deleted

## 2019-05-08 ENCOUNTER — Telehealth (INDEPENDENT_AMBULATORY_CARE_PROVIDER_SITE_OTHER): Payer: Self-pay | Admitting: *Deleted

## 2019-05-08 MED ORDER — SUPREP BOWEL PREP KIT 17.5-3.13-1.6 GM/177ML PO SOLN: 1.0000 | Freq: Once | ORAL | 0 refills | Status: AC

## 2019-05-08 NOTE — Telephone Encounter (Signed)
Patient needs suprep TCS sch'd 12/16

## 2019-05-23 ENCOUNTER — Ambulatory Visit (INDEPENDENT_AMBULATORY_CARE_PROVIDER_SITE_OTHER): Payer: Self-pay

## 2019-05-23 ENCOUNTER — Telehealth (INDEPENDENT_AMBULATORY_CARE_PROVIDER_SITE_OTHER): Payer: Self-pay | Admitting: *Deleted

## 2019-05-23 ENCOUNTER — Other Ambulatory Visit: Payer: Self-pay

## 2019-05-23 NOTE — Telephone Encounter (Signed)
Referring MD/PCP: miller   Procedure: tcs  Reason/Indication:  Screening, fam hx colon ca  Has patient had this procedure before?  no  If so, when, by whom and where?    Is there a family history of colon cancer?  Yes, uncles  Who?  What age when diagnosed?    Is patient diabetic?   no      Does patient have prosthetic heart valve or mechanical valve?  no  Do you have a pacemaker/defibrillator?  no  Has patient ever had endocarditis/atrial fibrillation? no  Does patient use oxygen? no  Has patient had joint replacement within last 12 months?  no  Is patient constipated or do they take laxatives? no  Does patient have a history of alcohol/drug use?  no  Is patient on blood thinner such as Coumadin, Plavix and/or Aspirin? no  Medications: none  Allergies: pcn  Medication Adjustment per Dr Laural Golden:   Procedure date & time: 06/20/19 at 100

## 2019-06-01 IMAGING — CT CT RENAL STONE PROTOCOL
2 of 4 series · 15 of 46 positions shown, 17 images · non-contrast
Comparison: Portable ureteral stent images from 10/05/2018. CT,
10/03/2018.

CLINICAL DATA: Patient here from home with complaints of right side
flank pain radiating around to abd. Reports being seen recently for
same. Denies trouble urinating. No nausea/vomiting. Surgery-gb,
appy, hysterectomy

EXAM:
CT ABDOMEN AND PELVIS WITHOUT CONTRAST
TECHNIQUE: Multidetector CT imaging of the abdomen and pelvis was performed
following the standard protocol without IV contrast.

[Series 2: axial st · axial · 0.73mm/px · z∈[+1057,+1487]mm · 12 of 98 slices shown, 14 images]
[im 6/98  soft-tissue]
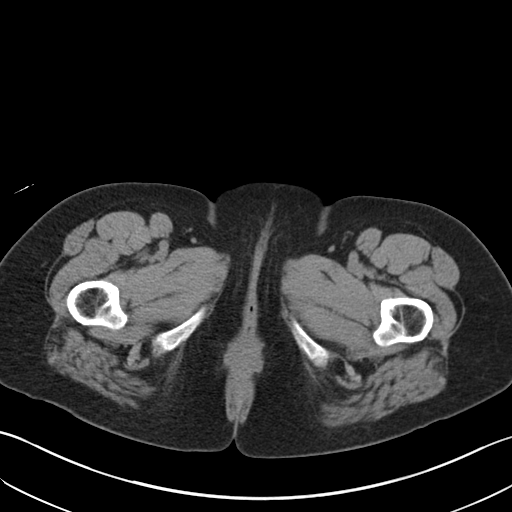
[im 6/98  bone]
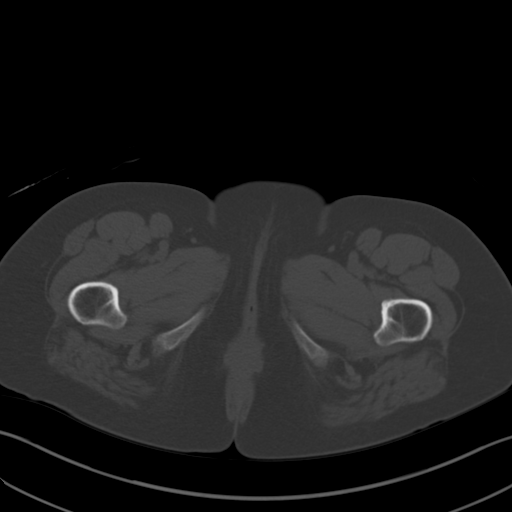
[im 16/98  soft-tissue]
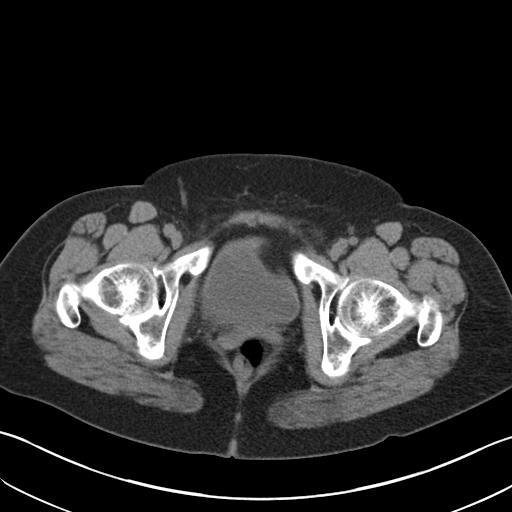
[im 21/98  soft-tissue]
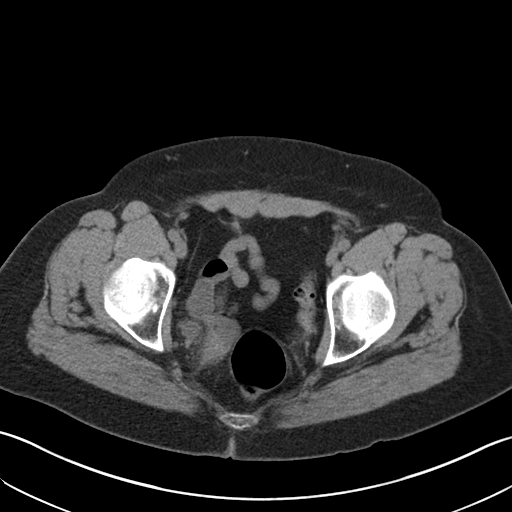
[im 31/98  soft-tissue]
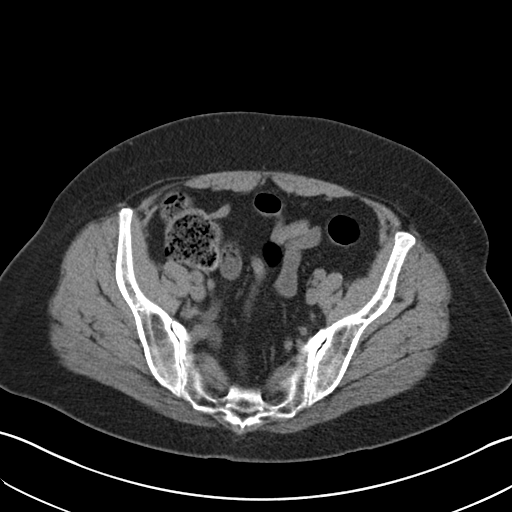
[im 36/98  soft-tissue]
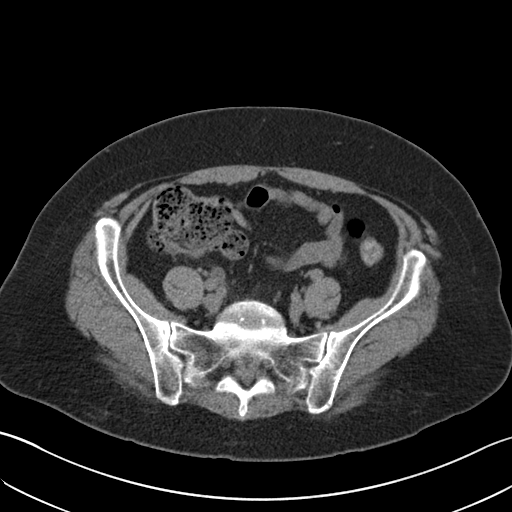
[im 46/98  soft-tissue]
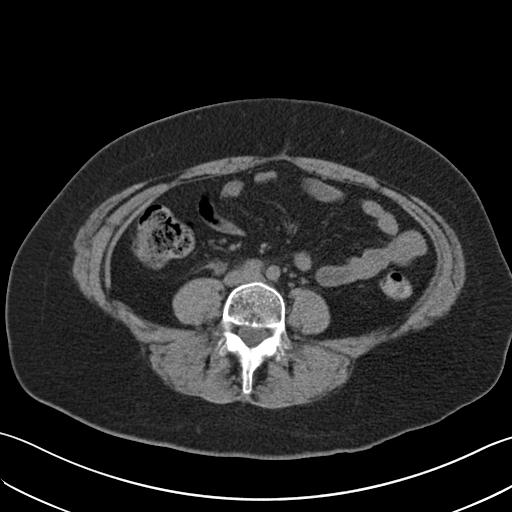
[im 52/98  soft-tissue]
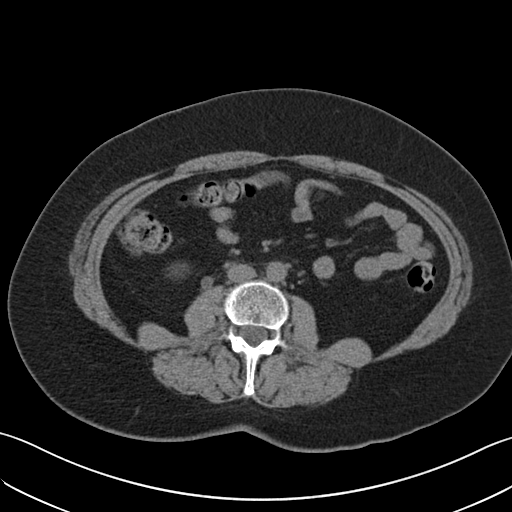
[im 62/98  soft-tissue]
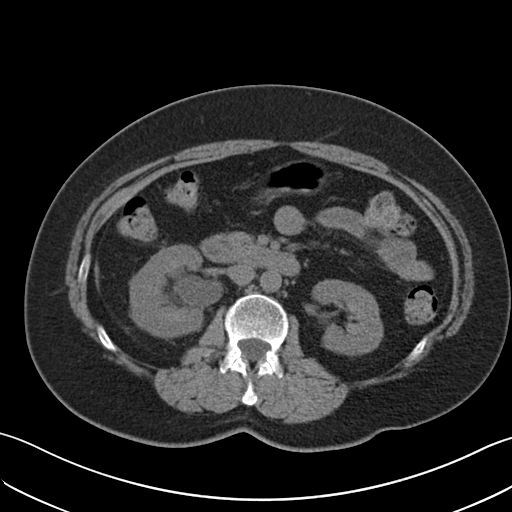
[im 67/98  soft-tissue]
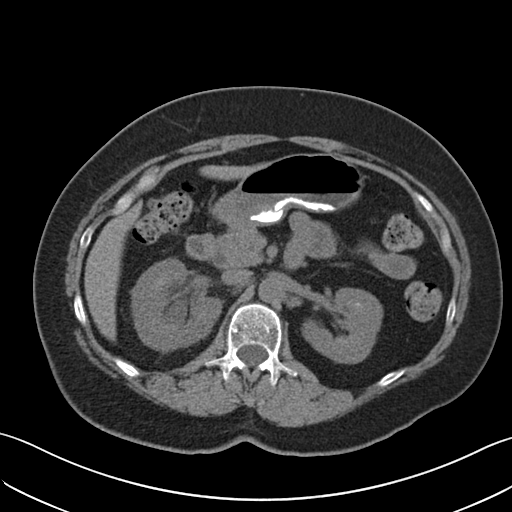
[im 67/98  bone]
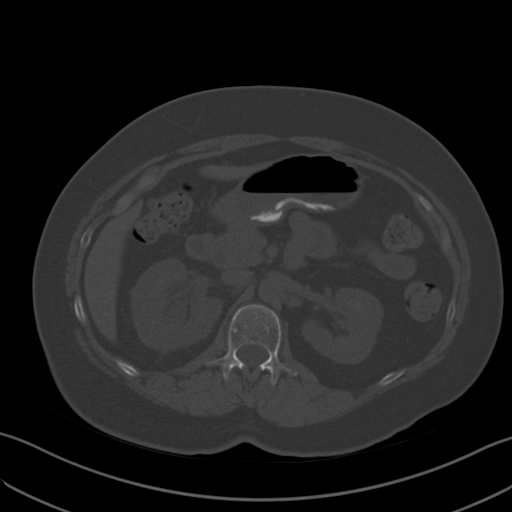
[im 77/98  soft-tissue]
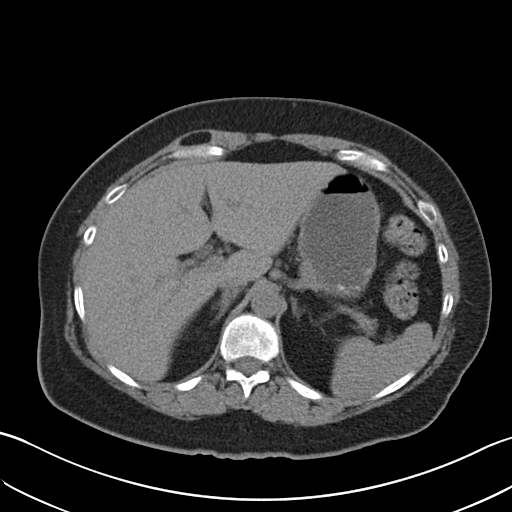
[im 82/98  soft-tissue]
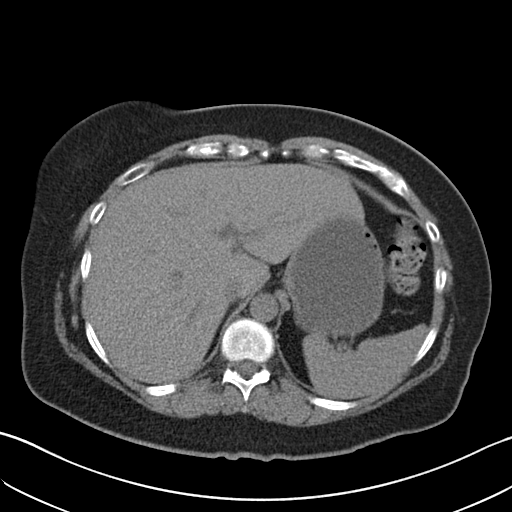
[im 92/98  soft-tissue]
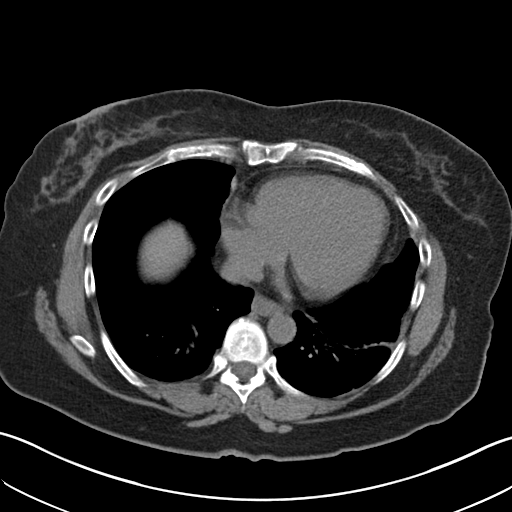

[Series 5: coronal · coronal · 0.82mm/px · 3 of 151 slices shown]
[im 51/151  soft-tissue]
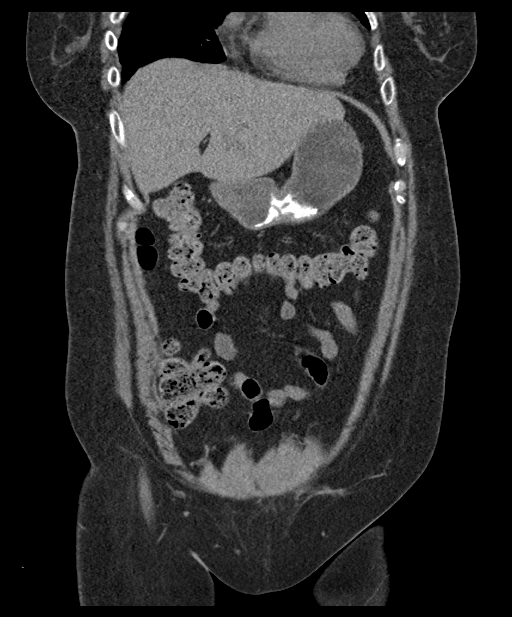
[im 67/151  soft-tissue]
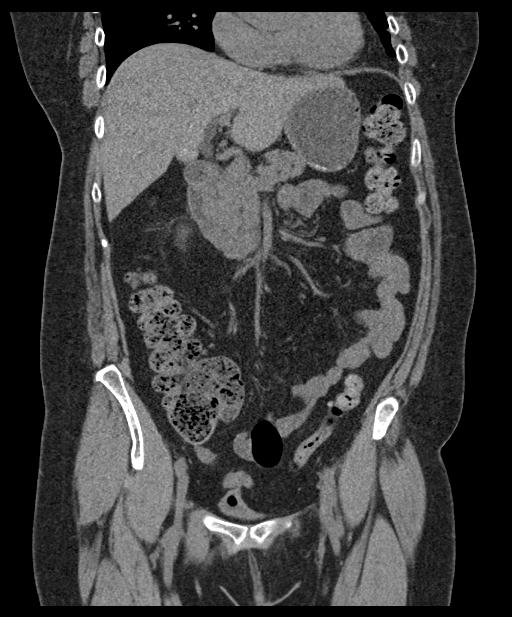
[im 84/151  soft-tissue]
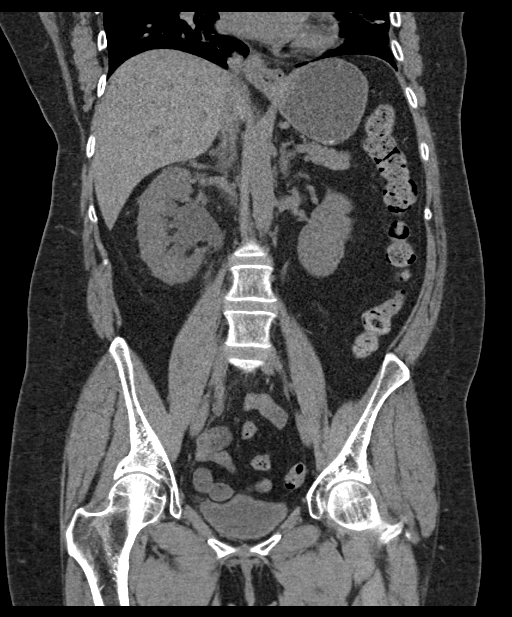

[15 of 46 positions shown; findings below may reference images not displayed]

FINDINGS: Lower chest: Linear lung base atelectasis.  No acute findings.

Hepatobiliary: Normal liver. Status post cholecystectomy. Mild
chronic dilation of common bile duct with normal distal tapering.

Pancreas: Unremarkable. No pancreatic ductal dilatation or
surrounding inflammatory changes.

Spleen: Normal in size without focal abnormality.

Adrenals/Urinary Tract: No adrenal masses.

Moderate right hydronephrosis. Dilation of the right ureter, which
is seen all the way to the bladder. There is a calcification
adjacent to the distal right ureter just below the pelvic brim. This
appears to be atherosclerotic calcification in the internal iliac
artery, and was present on the prior CT. The 5 mm stone seen in the
distal ureter on the most recent prior CT is no longer present.

There is right renal swelling with minor perinephric stranding. No
right renal masses or intrarenal stones.

Left kidney normal in size and position. Tiny nonobstructing stone
projects in the anterior midpole. No left hydronephrosis. Normal
left ureter.

Bladder mildly distended.  No bladder mass or stone.

Stomach/Bowel: Stomach is unremarkable. Small bowel and colon are
normal in caliber. No wall thickening or inflammation.

Vascular/Lymphatic: No significant vascular findings are present. No
enlarged abdominal or pelvic lymph nodes.

Reproductive: Status post hysterectomy. No adnexal masses.

Other: No abdominal wall hernia or abnormality. No abdominopelvic
ascites.

Musculoskeletal: No acute abnormality.  No bone lesion.
IMPRESSION: 1. Moderate right hydronephrosis with dilation of the right ureter.
No ureteral stone. The 5 mm stone noted in the distal ureter on the
prior CT is no longer present. Patient had a right ureteral stent
removed on 10/05/2018. Current hydronephrosis and hydroureter may be
residual. Given the current symptoms, however, suspect either a non
radiopaque obstruction in the distal right ureter or pyelonephritis.
2. No other acute abnormalities.

## 2019-06-11 NOTE — Telephone Encounter (Signed)
Colonoscopy with conscious sedation 

## 2019-06-18 ENCOUNTER — Other Ambulatory Visit (HOSPITAL_COMMUNITY)
Admission: RE | Admit: 2019-06-18 | Discharge: 2019-06-18 | Disposition: A | Discharge status: Home / Self Care | Payer: Managed Care, Other (non HMO) | Source: Ambulatory Visit | Attending: Internal Medicine | Admitting: Internal Medicine

## 2019-06-18 ENCOUNTER — Other Ambulatory Visit: Payer: Self-pay

## 2019-06-18 DIAGNOSIS — Z20828 Contact with and (suspected) exposure to other viral communicable diseases: Secondary | ICD-10-CM | POA: Insufficient documentation

## 2019-06-18 DIAGNOSIS — Z01812 Encounter for preprocedural laboratory examination: Secondary | ICD-10-CM | POA: Insufficient documentation

## 2019-06-18 LAB — SARS CORONAVIRUS 2 (TAT 6-24 HRS): SARS Coronavirus 2: NEGATIVE

## 2019-06-20 ENCOUNTER — Ambulatory Visit (HOSPITAL_COMMUNITY)
Admission: RE | Admit: 2019-06-20 | Discharge: 2019-06-20 | Disposition: A | Discharge status: Home / Self Care | Payer: Managed Care, Other (non HMO) | Attending: Internal Medicine | Admitting: Internal Medicine

## 2019-06-20 ENCOUNTER — Encounter (HOSPITAL_COMMUNITY)
Admission: RE | Disposition: A | Discharge status: Home / Self Care | Payer: Self-pay | Source: Home / Self Care | Attending: Internal Medicine

## 2019-06-20 ENCOUNTER — Encounter (HOSPITAL_COMMUNITY): Payer: Self-pay | Admitting: Internal Medicine

## 2019-06-20 ENCOUNTER — Other Ambulatory Visit: Payer: Self-pay

## 2019-06-20 DIAGNOSIS — R197 Diarrhea, unspecified: Secondary | ICD-10-CM | POA: Insufficient documentation

## 2019-06-20 DIAGNOSIS — Z8371 Family history of colonic polyps: Secondary | ICD-10-CM | POA: Insufficient documentation

## 2019-06-20 DIAGNOSIS — Z1211 Encounter for screening for malignant neoplasm of colon: Secondary | ICD-10-CM | POA: Diagnosis not present

## 2019-06-20 DIAGNOSIS — D125 Benign neoplasm of sigmoid colon: Secondary | ICD-10-CM | POA: Diagnosis not present

## 2019-06-20 DIAGNOSIS — D122 Benign neoplasm of ascending colon: Secondary | ICD-10-CM | POA: Diagnosis not present

## 2019-06-20 DIAGNOSIS — Z800 Family history of malignant neoplasm of digestive organs: Secondary | ICD-10-CM | POA: Insufficient documentation

## 2019-06-20 DIAGNOSIS — D120 Benign neoplasm of cecum: Secondary | ICD-10-CM | POA: Diagnosis not present

## 2019-06-20 DIAGNOSIS — Z86010 Personal history of colonic polyps: Secondary | ICD-10-CM | POA: Diagnosis not present

## 2019-06-20 HISTORY — PX: COLONOSCOPY: SHX5424

## 2019-06-20 HISTORY — PX: POLYPECTOMY: SHX5525

## 2019-06-20 HISTORY — PX: BIOPSY: SHX5522

## 2019-06-20 SURGERY — COLONOSCOPY
Anesthesia: Moderate Sedation

## 2019-06-20 MED ORDER — MEPERIDINE HCL 50 MG/ML IJ SOLN
INTRAMUSCULAR | Status: DC | PRN
  Administered 2019-06-20 (×3): 25 mg via INTRAVENOUS

## 2019-06-20 MED ORDER — MIDAZOLAM HCL 5 MG/5ML IJ SOLN
INTRAMUSCULAR | Status: DC | PRN
  Administered 2019-06-20 (×2): 1 mg via INTRAVENOUS
  Administered 2019-06-20: 3 mg via INTRAVENOUS
  Administered 2019-06-20 (×2): 2 mg via INTRAVENOUS

## 2019-06-20 MED ORDER — MEPERIDINE HCL 50 MG/ML IJ SOLN
INTRAMUSCULAR | Status: AC
  Filled 2019-06-20: qty 1

## 2019-06-20 MED ORDER — MIDAZOLAM HCL 5 MG/5ML IJ SOLN
INTRAMUSCULAR | Status: AC
  Filled 2019-06-20: qty 10

## 2019-06-20 MED ORDER — STERILE WATER FOR IRRIGATION IR SOLN
Status: DC | PRN
  Administered 2019-06-20: 1.5 mL

## 2019-06-20 MED ORDER — SODIUM CHLORIDE 0.9 % IV SOLN
INTRAVENOUS | Status: DC
  Administered 2019-06-20: 1000 mL via INTRAVENOUS

## 2019-06-20 NOTE — H&P (Signed)
Tonya Bowman is an 53 y.o. female.   Chief Complaint: Patient is here for colonoscopy. HPI: Patient is 53 year old Caucasian female who is here for screening colonoscopy.  She states she both her brother and sister had had polyps.  Her terminal uncle was diagnosed with colon carcinoma.  He is doing fine.  Patient states she has had diarrhea for the last 24 years.  She generally has 3-4 loose stools per day.  She has good appetite and weight has been stable.  She says she has external hemorrhoids and times noted small amount of blood with a bowel movement. She presently not taking any medications.  Past Medical History:  Diagnosis Date  . Complication of anesthesia   . History of kidney stones   . PONV (postoperative nausea and vomiting)     Past Surgical History:  Procedure Laterality Date  . ABDOMINAL HYSTERECTOMY    . APPENDECTOMY    . CHOLECYSTECTOMY    . CYSTOSCOPY WITH RETROGRADE PYELOGRAM, URETEROSCOPY AND STENT PLACEMENT Right 10/05/2018   Procedure: CYSTOSCOPY WITH RETROGRADE PYELOGRAM, URETEROSCOPY AND STENT REMOVAL;  Surgeon: Lucas Mallow, MD;  Location: [facility];  Service: Urology;  Laterality: Right;  . CYSTOSCOPY/URETEROSCOPY/HOLMIUM LASER/STENT PLACEMENT Right 10/03/2018   Procedure: CYSTOSCOPY/URETEROSCOPY/HOLMIUM LASER/STENT PLACEMENT;  Surgeon: Lucas Mallow, MD;  Location: [facility];  Service: Urology;  Laterality: Right;    Family History  Problem Relation Age of Onset  . Colon cancer Paternal Uncle    Social History:  reports that she has never smoked. She has never used smokeless tobacco. She reports previous alcohol use. She reports previous drug use.  Allergies:  Allergies  Allergen Reactions  . Penicillins Rash    Did it involve swelling of the face/tongue/throat, SOB, or low BP? No Did it involve sudden or severe rash/hives, skin peeling, or any reaction on the inside of your mouth or nose? No Did you need to seek medical attention at a hospital or  doctor's office? No When did it last happen?"in my 20's" If all above answers are "NO", may proceed with cephalosporin use.     Medications Prior to Admission  Medication Sig Dispense Refill  . ibuprofen (ADVIL,MOTRIN) 200 MG tablet Take 200 mg by mouth every 6 (six) hours as needed.    . potassium chloride SA (K-DUR,KLOR-CON) 20 MEQ tablet Take 2 tablets (40 mEq total) by mouth daily for 5 days. (Patient not taking: Reported on 10/07/2018) 10 tablet 0  . prochlorperazine (COMPAZINE) 10 MG tablet Take 10 mg by mouth every 6 (six) hours as needed for nausea or vomiting.     . tamsulosin (FLOMAX) 0.4 MG CAPS capsule Take 1 capsule (0.4 mg total) by mouth daily. (Patient not taking: Reported on 10/07/2018) 10 capsule 3    No results found for this or any previous visit (from the past 48 hour(s)). No results found.  Review of Systems  Blood pressure (!) 142/90, pulse 72, temperature 98.9 F (37.2 C), temperature source Oral, resp. rate 14, height 5\' 2"  (1.575 m), weight 74.8 kg, SpO2 100 %. Physical Exam  Constitutional: She appears well-developed and well-nourished.  HENT:  Mouth/Throat: Oropharynx is clear and moist.  Eyes: Conjunctivae are normal. No scleral icterus.  Neck: No thyromegaly present.  Cardiovascular: Normal rate, regular rhythm and normal heart sounds.  No murmur heard. Respiratory: Effort normal and breath sounds normal.  GI: Soft. She exhibits no distension.  She has Pfannenstiel, appendectomy and laparoscopy scars.  Musculoskeletal:  General: No edema.  Lymphadenopathy:    She has no cervical adenopathy.  Neurological: She is alert.  Skin: Skin is warm and dry.     Assessment/Plan Screening colonoscopy. Diarrhea possibly due to IBS.   Her risk may be somewhat elevated because of colon carcinoma in a second-degree relative and colonic polyps in both siblings.  Hildred Laser, MD 06/20/2019, 1:46 PM

## 2019-06-20 NOTE — Op Note (Signed)
[facility] Patient Name: Tonya Bowman Procedure Date: 06/20/2019 1:28 PM MRN: [id number] Date of Birth: 06-07-1966 Attending MD: Hildred Laser , MD CSN: [id number] Age: 53 Admit Type: Outpatient Procedure:                Colonoscopy Indications:              Screening for colorectal malignant neoplasm Providers:                Hildred Laser, MD, Charlsie Quest. Theda Sers RN, RN,                            Raphael Gibney, Technician Referring MD:             Janyth Pupa, DO Medicines:                Meperidine 75 mg IV, Midazolam 9 mg IV Complications:            No immediate complications. Estimated Blood Loss:     Estimated blood loss: none. Estimated blood loss                            was minimal. Procedure:                Pre-Anesthesia Assessment:                           - Prior to the procedure, a History and Physical                            was performed, and patient medications and                            allergies were reviewed. The patient's tolerance of                            previous anesthesia was also reviewed. The risks                            and benefits of the procedure and the sedation                            options and risks were discussed with the patient.                            All questions were answered, and informed consent                            was obtained. Prior Anticoagulants: The patient has                            taken no previous anticoagulant or antiplatelet                            agents. ASA Grade Assessment: I - A normal, healthy  patient. After reviewing the risks and benefits,                            the patient was deemed in satisfactory condition to                            undergo the procedure.                           After obtaining informed consent, the colonoscope                            was passed under direct vision. Throughout the   procedure, the patient's blood pressure, pulse, and                            oxygen saturations were monitored continuously. The                            [id number]-[id number]) scope was introduced through                            the anus and advanced to the the terminal ileum,                            with identification of the appendiceal orifice and                            IC valve. The colonoscopy was performed without                            difficulty. The patient tolerated the procedure                            well. The quality of the bowel preparation was                            adequate. The terminal ileum, ileocecal valve,                            appendiceal orifice, and rectum were photographed. Scope In: 2:07:04 PM Scope Out: 2:26:48 PM Scope Withdrawal Time: 0 hours 10 minutes 54 seconds  Total Procedure Duration: 0 hours 19 minutes 44 seconds  Findings:      The perianal and digital rectal examinations were normal.      The terminal ileum appeared normal.      A small polyp was found in the cecum. Biopsies were taken with a cold       forceps for histology. The pathology specimen was placed into Bottle       Number 1.      A small polyp was found in the ascending colon. The polyp was removed       with a cold snare. Resection and retrieval were complete. The pathology       specimen was placed into Bottle Number 2.      The  exam was otherwise normal throughout the examined colon.      Biopsies for histology were taken with a cold forceps from the ascending       colon and sigmoid colon for evaluation of microscopic colitis. The       pathology specimen was placed into Bottle Number 2.      The retroflexed view of the distal rectum and anal verge was normal and       showed no anal or rectal abnormalities. Impression:               - The examined portion of the ileum was normal.                           - One small polyp in the cecum. Biopsied.                            - One small polyp in the ascending colon, removed                            with a cold snare. Resected and retrieved.                           - Biopsies were taken with a cold forceps from the                            ascending colon and sigmoid colon for evaluation of                            microscopic colitis. Moderate Sedation:      Moderate (conscious) sedation was administered by the endoscopy nurse       and supervised by the endoscopist. The following parameters were       monitored: oxygen saturation, heart rate, blood pressure, CO2       capnography and response to care. Total physician intraservice time was       27 minutes. Recommendation:           - Patient has a contact number available for                            emergencies. The signs and symptoms of potential                            delayed complications were discussed with the                            patient. Return to normal activities tomorrow.                            Written discharge instructions were provided to the                            patient.                           - Resume previous diet today.                           -  Continue present medications.                           - No aspirin, ibuprofen, naproxen, or other                            non-steroidal anti-inflammatory drugs for 1 day.                           - Await pathology results.                           - Repeat colonoscopy is recommended. The                            colonoscopy date will be determined after pathology                            results from today's exam become available for                            review. Procedure Code(s):        --- Professional ---                           [phone], Colonoscopy, flexible; with removal of                            tumor(s), polyp(s), or other lesion(s) by snare                            technique                           [id number], 59,  Colonoscopy, flexible; with biopsy,                            single or multiple                           99153, Moderate sedation; each additional 15                            minutes intraservice time                           G0500, Moderate sedation services provided by the                            same physician or other qualified health care                            professional performing a gastrointestinal                            endoscopic service that sedation supports,  requiring the presence of an independent trained                            observer to assist in the monitoring of the                            patient's level of consciousness and physiological                            status; initial 15 minutes of intra-service time;                            patient age 44 years or older (additional time may                            be reported with [phone], as appropriate) Diagnosis Code(s):        --- Professional ---                           Z12.11, Encounter for screening for malignant                            neoplasm of colon                           K63.5, Polyp of colon CPT copyright 2019 [employer]. All rights reserved. The codes documented in this report are preliminary and upon coder review may  be revised to meet current compliance requirements. Hildred Laser, MD Hildred Laser, MD 06/20/2019 2:35:06 PM This report has been signed electronically. Number of Addenda: 0

## 2019-06-20 NOTE — Discharge Instructions (Signed)
No aspirin or NSAIDs for 24 hours. °Resume usual medications as before. °Resume usual diet. °No driving for 24 hours. °Physician will call with biopsy results. ° ° °Colonoscopy, Adult, Care After °This sheet gives you information about how to care for yourself after your procedure. Your doctor may also give you more specific instructions. If you have problems or questions, call your doctor. °What can I expect after the procedure? °After the procedure, it is common to have: °· A small amount of blood in your poop for 24 hours. °· Some gas. °· Mild cramping or bloating in your belly. °Follow these instructions at home: °General instructions °· For the first 24 hours after the procedure: °? Do not drive or use machinery. °? Do not sign important documents. °? Do not drink alcohol. °? Do your daily activities more slowly than normal. °? Eat foods that are soft and easy to digest. °· Take over-the-counter or prescription medicines only as told by your doctor. °To help cramping and bloating: ° °· Try walking around. °· Put heat on your belly (abdomen) as told by your doctor. Use a heat source that your doctor recommends, such as a moist heat pack or a heating pad. °? Put a towel between your skin and the heat source. °? Leave the heat on for 20-30 minutes. °? Remove the heat if your skin turns bright red. This is especially important if you cannot feel pain, heat, or cold. You can get burned. °Eating and drinking ° °· Drink enough fluid to keep your pee (urine) clear or pale yellow. °· Return to your normal diet as told by your doctor. Avoid heavy or fried foods that are hard to digest. °· Avoid drinking alcohol for as long as told by your doctor. °Contact a doctor if: °· You have blood in your poop (stool) 2-3 days after the procedure. °Get help right away if: °· You have more than a small amount of blood in your poop. °· You see large clumps of tissue (blood clots) in your poop. °· Your belly is swollen. °· You feel  sick to your stomach (nauseous). °· You throw up (vomit). °· You have a fever. °· You have belly pain that gets worse, and medicine does not help your pain. °Summary °· After the procedure, it is common to have a small amount of blood in your poop. You may also have mild cramping and bloating in your belly. °· For the first 24 hours after the procedure, do not drive or use machinery, do not sign important documents, and do not drink alcohol. °· Get help right away if you have a lot of blood in your poop, feel sick to your stomach, have a fever, or have more belly pain. °This information is not intended to replace advice given to you by your health care provider. Make sure you discuss any questions you have with your health care provider. °Document Released: 07/24/2010 Document Revised: 04/21/2017 Document Reviewed: 03/15/2016 °Elsevier Patient Education © 2020 [employer] ° °Colon Polyps ° °Polyps are tissue growths inside the body. Polyps can grow in many places, including the large intestine (colon). A polyp may be a round bump or a mushroom-shaped growth. You could have one polyp or several. °Most colon polyps are noncancerous (benign). However, some colon polyps can become cancerous over time. Finding and removing the polyps early can help prevent this. °What are the causes? °The exact cause of colon polyps is not known. °What increases the risk? °You are more   likely to develop this condition if you: °· Have a family history of colon cancer or colon polyps. °· Are older than 50 or older than 45 if you are African American. °· Have inflammatory bowel disease, such as ulcerative colitis or Crohn's disease. °· Have certain hereditary conditions, such as: °? Familial adenomatous polyposis. °? Lynch syndrome. °? Turcot syndrome. °? Peutz-Jeghers syndrome. °· Are overweight. °· Smoke cigarettes. °· Do not get enough exercise. °· Drink too much alcohol. °· Eat a diet that is high in fat and red meat and low in  fiber. °· Had childhood cancer that was treated with abdominal radiation. °What are the signs or symptoms? °Most polyps do not cause symptoms. °If you have symptoms, they may include: °· Blood coming from your rectum when having a bowel movement. °· Blood in your stool. The stool may look dark red or black. °· Abdominal pain. °· A change in bowel habits, such as constipation or diarrhea. °How is this diagnosed? °This condition is diagnosed with a colonoscopy. This is a procedure in which a lighted, flexible scope is inserted into the anus and then passed into the colon to examine the area. Polyps are sometimes found when a colonoscopy is done as part of routine cancer screening tests. °How is this treated? °Treatment for this condition involves removing any polyps that are found. Most polyps can be removed during a colonoscopy. Those polyps will then be tested for cancer. Additional treatment may be needed depending on the results of testing. °Follow these instructions at home: °Lifestyle °· Maintain a healthy weight, or lose weight if recommended by your health care provider. °· Exercise every day or as told by your health care provider. °· Do not use any products that contain nicotine or tobacco, such as cigarettes and e-cigarettes. If you need help quitting, ask your health care provider. °· If you drink alcohol, limit how much you have: °? 0-1 drink a day for women. °? 0-2 drinks a day for men. °· Be aware of how much alcohol is in your drink. In the [place] one drink equals one 12 oz bottle of beer (355 mL), one 5 oz glass of wine (148 mL), or one 1½ oz shot of hard liquor (44 mL). °Eating and drinking ° °· Eat foods that are high in fiber, such as fruits, vegetables, and whole grains. °· Eat foods that are high in calcium and vitamin D, such as milk, cheese, yogurt, eggs, liver, fish, and broccoli. °· Limit foods that are high in fat, such as fried foods and desserts. °· Limit the amount of red meat and  processed meat you eat, such as hot dogs, sausage, bacon, and lunch meats. °General instructions °· Keep all follow-up visits as told by your health care provider. This is important. °? This includes having regularly scheduled colonoscopies. °? Talk to your health care provider about when you need a colonoscopy. °Contact a health care provider if: °· You have new or worsening bleeding during a bowel movement. °· You have new or increased blood in your stool. °· You have a change in bowel habits. °· You lose weight for no known reason. °Summary °· Polyps are tissue growths inside the body. Polyps can grow in many places, including the colon. °· Most colon polyps are noncancerous (benign), but some can become cancerous over time. °· This condition is diagnosed with a colonoscopy. °· Treatment for this condition involves removing any polyps that are found. Most polyps can be removed during a   colonoscopy. °This information is not intended to replace advice given to you by your health care provider. Make sure you discuss any questions you have with your health care provider. °Document Released: 03/17/2004 Document Revised: 10/06/2017 Document Reviewed: 10/06/2017 °Elsevier Patient Education © 2020 [employer] ° °

## 2019-06-22 LAB — SURGICAL PATHOLOGY

## 2019-06-25 ENCOUNTER — Other Ambulatory Visit (INDEPENDENT_AMBULATORY_CARE_PROVIDER_SITE_OTHER): Payer: Self-pay | Admitting: Internal Medicine

## 2019-06-25 DIAGNOSIS — R197 Diarrhea, unspecified: Secondary | ICD-10-CM

## 2019-06-25 MED ORDER — DICYCLOMINE HCL 10 MG PO CAPS: 10.0000 mg | ORAL_CAPSULE | Freq: Three times a day (TID) | ORAL | 2 refills | Status: DC

## 2019-07-03 LAB — CELIAC DISEASE PANEL
(tTG) Ab, IgA: 1 U/mL
(tTG) Ab, IgG: 1 U/mL
Gliadin IgA: 5 Units
Gliadin IgG: 2 Units
Immunoglobulin A: 228 mg/dL (ref 47–310)

## 2019-09-17 ENCOUNTER — Other Ambulatory Visit (INDEPENDENT_AMBULATORY_CARE_PROVIDER_SITE_OTHER): Payer: Self-pay | Admitting: Internal Medicine

## 2020-04-23 LAB — HEPATIC FUNCTION PANEL
ALT: 19 (ref 7–35)
AST: 16 (ref 13–35)
Alkaline Phosphatase: 89 (ref 25–125)
Bilirubin, Direct: 0.1
Bilirubin, Total: 0.3

## 2020-04-23 LAB — TSH: TSH: 1.76 (ref <=5.90)

## 2020-04-23 LAB — LIPID PANEL
Cholesterol: 213 — AB (ref 0–200)
HDL: 56 (ref 35–70)
LDL Cholesterol: 137
Triglycerides: 98 (ref 40–160)

## 2020-04-23 LAB — COMPREHENSIVE METABOLIC PANEL
Albumin: 4.1 (ref 3.5–5.0)
Calcium: 9.3 (ref 8.7–10.7)
GFR calc non Af Amer: 100
Globulin: 2.8

## 2020-04-23 LAB — HEMOGLOBIN A1C: Hemoglobin A1C: 5.4

## 2020-04-23 LAB — IRON,TIBC AND FERRITIN PANEL
%SAT: 25
Ferritin: 98
Iron: 91
TIBC: 368

## 2020-04-23 LAB — VITAMIN D 25 HYDROXY (VIT D DEFICIENCY, FRACTURES): Vit D, 25-Hydroxy: 22

## 2020-04-23 LAB — CBC AND DIFFERENTIAL
HCT: 40 (ref 36–46)
Hemoglobin: 13.7 (ref 12.0–16.0)
WBC: 7.3

## 2020-04-23 LAB — BASIC METABOLIC PANEL
Creatinine: 0.6 (ref <=1.1)
Glucose: 95

## 2020-04-23 LAB — CBC: RBC: 4.51 (ref 3.87–5.11)

## 2020-05-14 ENCOUNTER — Encounter: Payer: Self-pay | Admitting: Family Medicine

## 2020-05-14 ENCOUNTER — Other Ambulatory Visit: Payer: Self-pay

## 2020-05-14 ENCOUNTER — Ambulatory Visit (INDEPENDENT_AMBULATORY_CARE_PROVIDER_SITE_OTHER): Payer: 59 | Admitting: Family Medicine

## 2020-05-14 VITALS — BP 122/78 | HR 94 | Temp 97.8°F | Ht 62.0 in | Wt 169.0 lb

## 2020-05-14 DIAGNOSIS — E782 Mixed hyperlipidemia: Secondary | ICD-10-CM

## 2020-05-14 DIAGNOSIS — R030 Elevated blood-pressure reading, without diagnosis of hypertension: Secondary | ICD-10-CM

## 2020-05-14 DIAGNOSIS — E559 Vitamin D deficiency, unspecified: Secondary | ICD-10-CM

## 2020-05-14 DIAGNOSIS — E6609 Other obesity due to excess calories: Secondary | ICD-10-CM | POA: Diagnosis not present

## 2020-05-14 DIAGNOSIS — G43809 Other migraine, not intractable, without status migrainosus: Secondary | ICD-10-CM | POA: Diagnosis not present

## 2020-05-14 DIAGNOSIS — Z1231 Encounter for screening mammogram for malignant neoplasm of breast: Secondary | ICD-10-CM

## 2020-05-14 DIAGNOSIS — Z6830 Body mass index (BMI) 30.0-30.9, adult: Secondary | ICD-10-CM

## 2020-05-14 MED ORDER — RIZATRIPTAN BENZOATE 10 MG PO TABS: 10.0000 mg | ORAL_TABLET | ORAL | 1 refills | Status: DC | PRN

## 2020-05-14 MED ORDER — VITAMIN D (ERGOCALCIFEROL) 1.25 MG (50000 UNIT) PO CAPS: 50000.0000 [IU] | ORAL_CAPSULE | ORAL | 1 refills | Status: DC

## 2020-05-14 NOTE — Progress Notes (Signed)
Subjective:  Patient ID: Tonya Bowman, female    DOB: [birth year]  Age: 54 y.o. MRN: [id number]  CC:  Chief Complaint  Patient presents with  . New Patient (Initial Visit)    here to establish. wants to discuss b/p being elevated      HPI  HPI  Tonya Bowman is a 54 year old female patient who presents today to establish care.  She works with [employer] here in our building.  And brings in a set of labs that she had drawn via work.  Her labs demonstrate that she has elevated cholesterol, low vitamin D she has antibody titer for Covid vaccine, elevated blood pressure reading.  All other labs appear to be within normal range.  Numbers will be abstracted for chart.  History is as stated below.  She reports that she has sleep trouble only on Friday evenings as she has to work early on Saturdays.  She has tried Benadryl but it gives her hangover.  Otherwise she sleeps normal.  She denies having trouble chewing or swallowing.  Has had a colonoscopy with Dr. Melony Overly.  Family history of colon cancer.  Denies having any blood in urine or stool.  Denies having any falls or injuries.  Denies any skin, hearing or vision changes.  Does wear glasses.  Has some forgetfulness at times.  Needs to get updated mammogram.  Currently about to be finalized with a divorce per her.  Has children and grandchildren whom she is close with.  Enjoys spending time with them when she is not working.  Her biggest concern today is that her blood pressure has been going up and down when she is at other appointments her work.  Her ranges that she provides today are as high as systolic 426 and diastolic 90.  Today in the office she is in normal range.  She does not check her blood pressures at home.  Is willing to get blood pressure monitor to check in.    Has had all of her vaccines and is up-to-date on these. Does need to get updated Pap smear.  Denies having any active chest pain, headaches, shortness of breath, cough or  exposure to Covid or any other illness at this time.  Today patient denies signs and symptoms of COVID 19 infection including fever, chills, cough, shortness of breath, and headache. Past Medical, Surgical, Social History, Allergies, and Medications have been Reviewed.   Past Medical History:  Diagnosis Date  . Complication of anesthesia   . Family hx of colon cancer 03/30/2019   Added automatically from request for surgery [phone]  . History of kidney stones   . PONV (postoperative nausea and vomiting)   . Special screening for malignant neoplasms, colon 03/30/2019   Added automatically from request for surgery [id number]    Current Meds  Medication Sig  . rizatriptan (MAXALT) 10 MG tablet Take 1 tablet (10 mg total) by mouth as needed for migraine. May repeat in 2 hours if needed  . [DISCONTINUED] rizatriptan (MAXALT) 10 MG tablet Take 10 mg by mouth as needed for migraine. May repeat in 2 hours if needed    ROS:  Review of Systems  Constitutional: Negative.   HENT: Negative.   Eyes: Negative.   Respiratory: Negative.   Cardiovascular: Negative.   Gastrointestinal: Negative.   Genitourinary: Negative.   Musculoskeletal: Negative.   Skin: Negative.   Neurological: Negative.   Endo/Heme/Allergies: Negative.   Psychiatric/Behavioral: Negative.      Objective:  Today's Vitals: BP 122/78 (BP Location: Left Arm, Patient Position: Sitting, Cuff Size: Normal)   Pulse 94   Temp 97.8 F (36.6 C) (Temporal)   Ht 5\' 2"  (1.575 m)   Wt 169 lb (76.7 kg)   SpO2 98%   BMI 30.91 kg/m  Vitals with BMI 05/14/2020 06/20/2019 06/20/2019  Height 5\' 2"  - -  Weight 169 lbs - -  BMI 76.5 - -  Systolic 465 035 465  Diastolic 78 66 78  Pulse 94 96 96     Physical Exam Vitals and nursing note reviewed.  Constitutional:      Appearance: Normal appearance. She is well-developed and well-groomed. She is obese.  HENT:     Head: Normocephalic and atraumatic.     Right Ear: External ear  normal.     Left Ear: External ear normal.     Mouth/Throat:     Comments: Mask in place  Eyes:     General:        Right eye: No discharge.        Left eye: No discharge.     Conjunctiva/sclera: Conjunctivae normal.  Cardiovascular:     Rate and Rhythm: Normal rate and regular rhythm.     Pulses: Normal pulses.     Heart sounds: Normal heart sounds.  Pulmonary:     Effort: Pulmonary effort is normal.     Breath sounds: Normal breath sounds.  Musculoskeletal:        General: Normal range of motion.     Cervical back: Normal range of motion and neck supple.  Skin:    General: Skin is warm.  Neurological:     General: No focal deficit present.     Mental Status: She is alert and oriented to person, place, and time.  Psychiatric:        Attention and Perception: Attention normal.        Mood and Affect: Mood normal.        Speech: Speech normal.        Behavior: Behavior normal. Behavior is cooperative.        Thought Content: Thought content normal.        Cognition and Memory: Cognition normal.        Judgment: Judgment normal.      Assessment   1. Elevated blood pressure reading without diagnosis of hypertension   2. Vitamin D deficiency   3. Other migraine without status migrainosus, not intractable   4. Class 1 obesity due to excess calories without serious comorbidity with body mass index (BMI) of 30.0 to 30.9 in adult   5. Encounter for screening mammogram for malignant neoplasm of breast   6. Mixed hyperlipidemia     Tests ordered Orders Placed This Encounter  Procedures  . MM 3D SCREEN BREAST BILATERAL    Plan: Please see assessment and plan per problem list above.   Meds ordered this encounter  Medications  . rizatriptan (MAXALT) 10 MG tablet    Sig: Take 1 tablet (10 mg total) by mouth as needed for migraine. May repeat in 2 hours if needed    Dispense:  10 tablet    Refill:  1    Order Specific Question:   Supervising Provider    Answer:    SIMPSON, MARGARET E [[id number]]  . Vitamin D, Ergocalciferol, (DRISDOL) 1.25 MG (50000 UNIT) CAPS capsule    Sig: Take 1 capsule (50,000 Units total) by mouth every 7 (seven) days.  Dispense:  12 capsule    Refill:  1    Order Specific Question:   Supervising Provider    Answer:   Fayrene Helper [[id number]]    Patient to follow-up in Visit date not found   Note: This dictation was prepared with Dragon dictation along with smaller phrase technology. Similar sounding words can be transcribed inadequately or may not be corrected upon review. Any transcriptional errors that result from this process are unintentional.      Perlie Mayo, NP

## 2020-05-14 NOTE — Patient Instructions (Addendum)
  HAPPY FALL!  I appreciate the opportunity to provide you with care for your health and wellness. Today we discussed: established care   Follow up: 6 months for BP, cholesterol check (labs order day of appt) ([facility])  No labs or referrals today  Vitamin D 2 once weekly for 12 weeks  Vitamin D 3 1,000 there after daily  MyChart message your blood pressure numbers (2 weeks)  we will see what medication might be best at that time to start you on.  [name]!  Please continue to practice social distancing to keep you, your family, and our community safe.  If you must go out, please wear a mask and practice good handwashing.  It was a pleasure to see you and I look forward to continuing to work together on your health and well-being. Please do not hesitate to call the office if you need care or have questions about your care.  Have a wonderful day and week. With Gratitude, Cherly Beach, DNP, AGNP-BC

## 2020-05-16 ENCOUNTER — Encounter: Payer: Self-pay | Admitting: Family Medicine

## 2020-05-16 DIAGNOSIS — R030 Elevated blood-pressure reading, without diagnosis of hypertension: Secondary | ICD-10-CM | POA: Insufficient documentation

## 2020-05-16 DIAGNOSIS — E785 Hyperlipidemia, unspecified: Secondary | ICD-10-CM | POA: Insufficient documentation

## 2020-05-16 DIAGNOSIS — G43909 Migraine, unspecified, not intractable, without status migrainosus: Secondary | ICD-10-CM | POA: Insufficient documentation

## 2020-05-16 DIAGNOSIS — E669 Obesity, unspecified: Secondary | ICD-10-CM | POA: Insufficient documentation

## 2020-05-16 DIAGNOSIS — E559 Vitamin D deficiency, unspecified: Secondary | ICD-10-CM | POA: Insufficient documentation

## 2020-05-16 DIAGNOSIS — Z1231 Encounter for screening mammogram for malignant neoplasm of breast: Secondary | ICD-10-CM | POA: Insufficient documentation

## 2020-05-16 NOTE — Assessment & Plan Note (Signed)
Declines wanting to start medication at this time. Will try diet and lifestyle measures first.

## 2020-05-16 NOTE — Assessment & Plan Note (Signed)
Uses maxalt as needed. Works well. Refill provided

## 2020-05-16 NOTE — Assessment & Plan Note (Signed)
Brought in lab work she got through her work.  Vitamin D level is low- will do 12 week script and transition to D3 OTC Patient acknowledged agreement and understanding of the plan.

## 2020-05-16 NOTE — Assessment & Plan Note (Signed)
Reports elevation recently. Normal in office.  Advised to monitor for now and we will look at her numbers in a few weeks to see if medication is needed.

## 2020-05-16 NOTE — Assessment & Plan Note (Signed)
  [name] is ducated about the importance of exercise daily to help with weight management. A minumum of 30 minutes daily is recommended. Additionally, importance of healthy food choices  with portion control discussed.   Wt Readings from Last 3 Encounters:  05/14/20 169 lb (76.7 kg)  06/20/19 165 lb (74.8 kg)  10/03/18 160 lb 12.8 oz (72.9 kg)

## 2020-09-17 ENCOUNTER — Encounter: Payer: Self-pay | Admitting: Obstetrics and Gynecology

## 2020-09-24 ENCOUNTER — Other Ambulatory Visit: Payer: Self-pay

## 2020-09-24 MED ORDER — RIZATRIPTAN BENZOATE 10 MG PO TABS: 10.0000 mg | ORAL_TABLET | ORAL | 1 refills | Status: DC | PRN

## 2020-11-22 ENCOUNTER — Other Ambulatory Visit: Payer: Self-pay | Admitting: Family Medicine

## 2020-11-22 DIAGNOSIS — E559 Vitamin D deficiency, unspecified: Secondary | ICD-10-CM

## 2020-12-08 ENCOUNTER — Encounter: Payer: Self-pay | Admitting: Nurse Practitioner

## 2020-12-08 ENCOUNTER — Ambulatory Visit (INDEPENDENT_AMBULATORY_CARE_PROVIDER_SITE_OTHER): Payer: 59 | Admitting: Nurse Practitioner

## 2020-12-08 ENCOUNTER — Other Ambulatory Visit: Payer: Self-pay

## 2020-12-08 DIAGNOSIS — R413 Other amnesia: Secondary | ICD-10-CM

## 2020-12-08 DIAGNOSIS — I10 Essential (primary) hypertension: Secondary | ICD-10-CM

## 2020-12-08 DIAGNOSIS — E663 Overweight: Secondary | ICD-10-CM | POA: Insufficient documentation

## 2020-12-08 DIAGNOSIS — J329 Chronic sinusitis, unspecified: Secondary | ICD-10-CM | POA: Insufficient documentation

## 2020-12-08 DIAGNOSIS — Z90710 Acquired absence of both cervix and uterus: Secondary | ICD-10-CM | POA: Insufficient documentation

## 2020-12-08 DIAGNOSIS — R030 Elevated blood-pressure reading, without diagnosis of hypertension: Secondary | ICD-10-CM | POA: Diagnosis not present

## 2020-12-08 DIAGNOSIS — A6000 Herpesviral infection of urogenital system, unspecified: Secondary | ICD-10-CM | POA: Insufficient documentation

## 2020-12-08 DIAGNOSIS — E785 Hyperlipidemia, unspecified: Secondary | ICD-10-CM

## 2020-12-08 DIAGNOSIS — G43709 Chronic migraine without aura, not intractable, without status migrainosus: Secondary | ICD-10-CM | POA: Insufficient documentation

## 2020-12-08 DIAGNOSIS — E559 Vitamin D deficiency, unspecified: Secondary | ICD-10-CM | POA: Diagnosis not present

## 2020-12-08 DIAGNOSIS — R0683 Snoring: Secondary | ICD-10-CM

## 2020-12-08 NOTE — Assessment & Plan Note (Signed)
-  she states she snores at night and would like home sleep study -will get home sleep study with [facility] to r/o sleep apnea

## 2020-12-08 NOTE — Assessment & Plan Note (Signed)
-  she states that she has had some short-term memory loss and her father had Alzheimer's -MMSE today 30/30

## 2020-12-08 NOTE — Assessment & Plan Note (Signed)
Lab Results  Component Value Date   CHOL 213 (A) 04/23/2020   HDL 56 04/23/2020   LDLCALC 137 04/23/2020   TRIG 98 04/23/2020   -will check lipid panel today

## 2020-12-08 NOTE — Assessment & Plan Note (Signed)
BP Readings from Last 3 Encounters:  12/08/20 (!) 147/106  05/14/20 122/78  06/20/19 110/66   -BP elevated today

## 2020-12-08 NOTE — Assessment & Plan Note (Addendum)
BP Readings from Last 3 Encounters:  12/08/20 (!) 147/106  05/14/20 122/78  06/20/19 110/66   -BP elevated today, and she has had elevated home readings -Considered Rx. HCTZ, but she has had great home BP readings -she states she snores at night and would like home sleep study

## 2020-12-08 NOTE — Progress Notes (Addendum)
Acute Office Visit  Subjective:    Patient ID: Tonya Bowman, female    DOB: Sep 05, 1965, 55 y.o.   MRN: [id number]  Chief Complaint  Patient presents with  . Follow-up    6 months follow up bp would like sleep study and having memory issues father had alzimhers so wants to get checked out     HPI Patient is in today for BP check. She saw Jarrett Soho on 05/14/20, and at that time she had elevated lipids and was having elevated home BPs (but had BP WNL at her OV). She has low Vit D, so she took a 12-wk course of Vit D and swapped to OTC after the 12-wks. We refilled her vit D because she likes to take 1 pill per week. She states she is not good at remembering a daily pill.  She has more energy with the Vit D tablet.  Today, she states that she is stressing bc she is going to [place] tomorrow to visit the [employer] and she has been stressed trying to get things ready for travel.  She took metoprolol in the past.  She states that she snores at night, and she would like to get a sleep study.  She is concerned with her memory. Her father had Alzheimer's. She states that she has had some short-term memory issues.    Past Medical History:  Diagnosis Date  . Complication of anesthesia   . Family hx of colon cancer 03/30/2019   Added automatically from request for surgery [id number]  . History of kidney stones   . PONV (postoperative nausea and vomiting)   . Special screening for malignant neoplasms, colon 03/30/2019   Added automatically from request for surgery [id number]    Past Surgical History:  Procedure Laterality Date  . ABDOMINAL HYSTERECTOMY    . APPENDECTOMY    . BIOPSY  06/20/2019   Procedure: BIOPSY;  Surgeon: Rogene Houston, MD;  Location: [facility];  Service: Endoscopy;;  random colon  . CHOLECYSTECTOMY    . COLONOSCOPY N/A 06/20/2019   Procedure: COLONOSCOPY;  Surgeon: Rogene Houston, MD;  Location: [facility];  Service: Endoscopy;  Laterality: N/A;  1200-office  rescheduled to 12/16/ @ 1:00pm  . CYSTOSCOPY WITH RETROGRADE PYELOGRAM, URETEROSCOPY AND STENT PLACEMENT Right 10/05/2018   Procedure: CYSTOSCOPY WITH RETROGRADE PYELOGRAM, URETEROSCOPY AND STENT REMOVAL;  Surgeon: Lucas Mallow, MD;  Location: [facility];  Service: Urology;  Laterality: Right;  . CYSTOSCOPY/URETEROSCOPY/HOLMIUM LASER/STENT PLACEMENT Right 10/03/2018   Procedure: CYSTOSCOPY/URETEROSCOPY/HOLMIUM LASER/STENT PLACEMENT;  Surgeon: Lucas Mallow, MD;  Location: [facility];  Service: Urology;  Laterality: Right;  . POLYPECTOMY  06/20/2019   Procedure: POLYPECTOMY;  Surgeon: Rogene Houston, MD;  Location: [facility];  Service: Endoscopy;;  ascending colon, cecal     Family History  Problem Relation Age of Onset  . Colon cancer Paternal Uncle     Social History   Socioeconomic History  . Marital status: Legally Separated    Spouse name: Not on file  . Number of children: 1  . Years of education: Not on file  . Highest education level: Not on file  Occupational History  . Not on file  Tobacco Use  . Smoking status: Never Smoker  . Smokeless tobacco: Never Used  Vaping Use  . Vaping Use: Never used  Substance and Sexual Activity  . Alcohol use: Not Currently  . Drug use: Not Currently  . Sexual activity: Not Currently  Other  Topics Concern  . Not on file  Social History Narrative   Lives alone   1 daughter and 2 grandchildren- 79 boy, 1 girl      Enjoy: vacationing with family       Diet: eats all food group outside seafood    Caffeine: 3 cups or more daily    Water: 1-2 cups daily       Wears seat belt    Does not use phone while driving    Oceanographer at home    Weapons- safe location    Social Determinants of Health   Financial Resource Strain: Low Risk   . Difficulty of Paying Living Expenses: Not hard at all  Food Insecurity: No Food Insecurity  . Worried About Charity fundraiser in the Last Year: Never true  . Ran Out of Food in the Last  Year: Never true  Transportation Needs: No Transportation Needs  . Lack of Transportation (Medical): No  . Lack of Transportation (Non-Medical): No  Physical Activity: Inactive  . Days of Exercise per Week: 0 days  . Minutes of Exercise per Session: 0 min  Stress: No Stress Concern Present  . Feeling of Stress : Not at all  Social Connections: Moderately Isolated  . Frequency of Communication with Friends and Family: More than three times a week  . Frequency of Social Gatherings with Friends and Family: Three times a week  . Attends Religious Services: More than 4 times per year  . Active Member of Clubs or Organizations: No  . Attends Archivist Meetings: Never  . Marital Status: Divorced  Human resources officer Violence: Not At Risk  . Fear of Current or Ex-Partner: No  . Emotionally Abused: No  . Physically Abused: No  . Sexually Abused: No    Outpatient Medications Prior to Visit  Medication Sig Dispense Refill  . rizatriptan (MAXALT) 10 MG tablet Take 1 tablet (10 mg total) by mouth as needed for migraine. May repeat in 2 hours if needed 10 tablet 1  . Vitamin D, Ergocalciferol, (DRISDOL) 1.25 MG (50000 UNIT) CAPS capsule TAKE 1 CAPSULE (50,000 UNITS TOTAL) BY MOUTH EVERY 7 (SEVEN) DAYS 12 capsule 1   No facility-administered medications prior to visit.    Allergies  Allergen Reactions  . Penicillins Rash    Did it involve swelling of the face/tongue/throat, SOB, or low BP? No Did it involve sudden or severe rash/hives, skin peeling, or any reaction on the inside of your mouth or nose? No Did you need to seek medical attention at a hospital or doctor's office? No When did it last happen?"in my 20's" If all above answers are "NO", may proceed with cephalosporin use.     Review of Systems     Objective:    Physical Exam  BP (!) 147/106 (BP Location: Right Arm, Patient Position: Sitting, Cuff Size: Normal)   Pulse 77   Temp 98.6 F (37 C) (Oral)   Resp 18    Ht 5\' 2"  (1.575 m)   Wt 170 lb 1.9 oz (77.2 kg)   SpO2 97%   BMI 31.12 kg/m  Wt Readings from Last 3 Encounters:  12/08/20 170 lb 1.9 oz (77.2 kg)  05/14/20 169 lb (76.7 kg)  06/20/19 165 lb (74.8 kg)    Health Maintenance Due  Topic Date Due  . PAP SMEAR-Modifier  Never done  . Zoster Vaccines- Shingrix (1 of 2) Never done  . MAMMOGRAM  05/08/2016  . COVID-19 Vaccine (  3 - Booster for Moderna series) 03/23/2020    There are no preventive care reminders to display for this patient.   Lab Results  Component Value Date   TSH 1.76 04/23/2020   Lab Results  Component Value Date   WBC 7.3 04/23/2020   HGB 13.7 04/23/2020   HCT 40 04/23/2020   MCV 92.1 10/07/2018   PLT 288 10/07/2018   Lab Results  Component Value Date   NA 142 10/07/2018   K 3.3 (L) 10/07/2018   CO2 24 10/07/2018   GLUCOSE 98 10/07/2018   BUN 20 10/07/2018   CREATININE 0.6 04/23/2020   BILITOT 0.7 10/07/2018   ALKPHOS 89 04/23/2020   AST 16 04/23/2020   ALT 19 04/23/2020   PROT 7.7 10/07/2018   ALBUMIN 4.1 04/23/2020   CALCIUM 9.3 04/23/2020   ANIONGAP 10 10/07/2018   Lab Results  Component Value Date   CHOL 213 (A) 04/23/2020   Lab Results  Component Value Date   HDL 56 04/23/2020   Lab Results  Component Value Date   LDLCALC 137 04/23/2020   Lab Results  Component Value Date   TRIG 98 04/23/2020   No results found for: [facility] Lab Results  Component Value Date   HGBA1C 5.4 04/23/2020       Assessment & Plan:   Problem List Items Addressed This Visit      Cardiovascular and Mediastinum   Hypertension    BP Readings from Last 3 Encounters:  12/08/20 (!) 147/106  05/14/20 122/78  06/20/19 110/66   -BP elevated today, and she has had elevated home readings -Considered Rx. HCTZ, but she has had great home BP readings -she states she snores at night and would like home sleep study      Relevant Orders   COMPLETE METABOLIC PANEL WITH GFR   Lipid Profile     Other    Vitamin D deficiency    -took 12-wks of Rx. Vit D; she didn't transition to OTC because she prefers once per week dosing- it is easier for her to remember -will recheck vit D today      Hyperlipidemia    Lab Results  Component Value Date   CHOL 213 (A) 04/23/2020   HDL 56 04/23/2020   LDLCALC 137 04/23/2020   TRIG 98 04/23/2020   -will check lipid panel today      Relevant Orders   Lipid Profile   Snoring    -she states she snores at night and would like home sleep study -will get home sleep study with [facility] to r/o sleep apnea       Relevant Orders   Home sleep test   Memory change    -she states that she has had some short-term memory loss and her father had Alzheimer's -MMSE today 30/30      RESOLVED: Elevated blood pressure reading without diagnosis of hypertension    BP Readings from Last 3 Encounters:  12/08/20 (!) 147/106  05/14/20 122/78  06/20/19 110/66   -BP elevated today          No orders of the defined types were placed in this encounter.    Noreene Larsson, NP

## 2020-12-08 NOTE — Assessment & Plan Note (Addendum)
-  took 12-wks of Rx. Vit D; she didn't transition to OTC because she prefers once per week dosing- it is easier for her to remember -will recheck vit D today

## 2020-12-08 NOTE — Addendum Note (Signed)
Addended by: Demetrius Revel on: 12/08/2020 10:57 AM   Modules accepted: Orders

## 2020-12-16 ENCOUNTER — Ambulatory Visit: Payer: 59 | Admitting: Nurse Practitioner

## 2021-02-19 ENCOUNTER — Telehealth (INDEPENDENT_AMBULATORY_CARE_PROVIDER_SITE_OTHER): Payer: 59 | Admitting: Nurse Practitioner

## 2021-02-19 ENCOUNTER — Other Ambulatory Visit: Payer: Self-pay

## 2021-02-19 ENCOUNTER — Encounter: Payer: Self-pay | Admitting: Nurse Practitioner

## 2021-02-19 DIAGNOSIS — B349 Viral infection, unspecified: Secondary | ICD-10-CM | POA: Diagnosis not present

## 2021-02-19 MED ORDER — HYDROCOD POLST-CPM POLST ER 10-8 MG/5ML PO SUER: 5.0000 mL | Freq: Two times a day (BID) | ORAL | 0 refills | Status: DC | PRN

## 2021-02-19 MED ORDER — ONDANSETRON 4 MG PO TBDP: 4.0000 mg | ORAL_TABLET | Freq: Three times a day (TID) | ORAL | 0 refills | Status: DC | PRN

## 2021-02-19 MED ORDER — LOPERAMIDE HCL 2 MG PO TABS: 2.0000 mg | ORAL_TABLET | Freq: Four times a day (QID) | ORAL | 0 refills | Status: DC | PRN

## 2021-02-19 MED ORDER — ACETAMINOPHEN 325 MG PO TABS: 650.0000 mg | ORAL_TABLET | Freq: Four times a day (QID) | ORAL | 0 refills | Status: AC | PRN

## 2021-02-19 MED ORDER — NIRMATRELVIR/RITONAVIR (PAXLOVID)TABLET: 3.0000 | ORAL_TABLET | Freq: Two times a day (BID) | ORAL | 0 refills | Status: AC

## 2021-02-19 NOTE — Assessment & Plan Note (Addendum)
-  having diarrhea and vomiting; concerned for dehydration as she has been losing fluids via GI tract and unable to replace them -Rx. Imodium and zofran -we discussed drinking clear fluids like water or ginger ale; if unable to keep these down, she will need to go to emergency department for IV hydration -Rx. tussinex for cough -Rx. Tylenol for fever -will get COVID testing; d/t date of symptom onset will Rx. paxlovid and may stop if COVID is negative

## 2021-02-19 NOTE — Progress Notes (Signed)
Acute Office Visit  Subjective:    Patient ID: Tonya Bowman, female    DOB: Nov 24, 1965, 55 y.o.   MRN: [id number]  Chief Complaint  Patient presents with   Cough    Ongoing x4 days    Emesis    Onoging x4 days as well as diarrhea   Chills    Ongoing x4 days   Fever    Ongoing x4 days, 102 temp at the highest    Cough Associated symptoms include chills, a fever and rhinorrhea. Pertinent negatives include no shortness of breath or wheezing.  Emesis  Associated symptoms include chills, coughing, diarrhea and a fever. Pertinent negatives include no abdominal pain.  Fever  Associated symptoms include congestion, coughing, diarrhea, nausea and vomiting. Pertinent negatives include no abdominal pain or wheezing.  Patient is in today for sick visit. She states she hasn't drank much since vomiting, and last vomiting was yesterday. She lost the sense of taste yesterday. Temp at home is 101 about 30 minutes ago. Last BM was diarrhea.  She has been taking ibuprofen.  Past Medical History:  Diagnosis Date   Complication of anesthesia    Family hx of colon cancer 03/30/2019   Added automatically from request for surgery [phone]   History of kidney stones    PONV (postoperative nausea and vomiting)    Special screening for malignant neoplasms, colon 03/30/2019   Added automatically from request for surgery [id number]    Past Surgical History:  Procedure Laterality Date   ABDOMINAL HYSTERECTOMY     APPENDECTOMY     BIOPSY  06/20/2019   Procedure: BIOPSY;  Surgeon: Rogene Houston, MD;  [facility]: [facility];  Service: Endoscopy;;  random colon   CHOLECYSTECTOMY     COLONOSCOPY N/A 06/20/2019   Procedure: COLONOSCOPY;  Surgeon: Rogene Houston, MD;  Location: [facility];  Service: Endoscopy;  Laterality: N/A;  1200-office rescheduled to 12/16/ @ 1:00pm   CYSTOSCOPY WITH RETROGRADE PYELOGRAM, URETEROSCOPY AND STENT PLACEMENT Right 10/05/2018   Procedure: CYSTOSCOPY WITH  RETROGRADE PYELOGRAM, URETEROSCOPY AND STENT REMOVAL;  Surgeon: Lucas Mallow, MD;  Location: [facility];  Service: Urology;  Laterality: Right;   CYSTOSCOPY/URETEROSCOPY/HOLMIUM LASER/STENT PLACEMENT Right 10/03/2018   Procedure: CYSTOSCOPY/URETEROSCOPY/HOLMIUM LASER/STENT PLACEMENT;  Surgeon: Lucas Mallow, MD;  Location: [facility];  Service: Urology;  Laterality: Right;   POLYPECTOMY  06/20/2019   Procedure: POLYPECTOMY;  Surgeon: Rogene Houston, MD;  Location: [facility];  Service: Endoscopy;;  ascending colon, cecal     Family History  Problem Relation Age of Onset   Colon cancer Paternal Uncle     Social History   Socioeconomic History   Marital status: Legally Separated    Spouse name: Not on file   Number of children: 1   Years of education: Not on file   Highest education level: Not on file  Occupational History   Not on file  Tobacco Use   Smoking status: Never   Smokeless tobacco: Never  Vaping Use   Vaping Use: Never used  Substance and Sexual Activity   Alcohol use: Not Currently   Drug use: Not Currently   Sexual activity: Not Currently  Other Topics Concern   Not on file  Social History Narrative   Lives alone   1 daughter and 2 grandchildren- 58 boy, 1 girl      Enjoy: vacationing with family       Diet: eats all food group outside seafood  Caffeine: 3 cups or more daily    Water: 1-2 cups daily       Wears seat belt    Does not use phone while driving    Smoke detectors at home    Weapons- safe location    Social Determinants of Health   Financial Resource Strain: Low Risk    Difficulty of Paying Living Expenses: Not hard at all  Food Insecurity: No Food Insecurity   Worried About Charity fundraiser in the Last Year: Never true   Arboriculturist in the Last Year: Never true  Transportation Needs: No Transportation Needs   Lack of Transportation (Medical): No   Lack of Transportation (Non-Medical): No  Physical Activity: Inactive    Days of Exercise per Week: 0 days   Minutes of Exercise per Session: 0 min  Stress: No Stress Concern Present   Feeling of Stress : Not at all  Social Connections: Moderately Isolated   Frequency of Communication with Friends and Family: More than three times a week   Frequency of Social Gatherings with Friends and Family: Three times a week   Attends Religious Services: More than 4 times per year   Active Member of Clubs or Organizations: No   Attends Archivist Meetings: Never   Marital Status: Divorced  Human resources officer Violence: Not At Risk   Fear of Current or Ex-Partner: No   Emotionally Abused: No   Physically Abused: No   Sexually Abused: No    Outpatient Medications Prior to Visit  Medication Sig Dispense Refill   rizatriptan (MAXALT) 10 MG tablet Take 1 tablet (10 mg total) by mouth as needed for migraine. May repeat in 2 hours if needed 10 tablet 1   Vitamin D, Ergocalciferol, (DRISDOL) 1.25 MG (50000 UNIT) CAPS capsule TAKE 1 CAPSULE (50,000 UNITS TOTAL) BY MOUTH EVERY 7 (SEVEN) DAYS 12 capsule 1   No facility-administered medications prior to visit.    Allergies  Allergen Reactions   Penicillins Rash    Did it involve swelling of the face/tongue/throat, SOB, or low BP? No Did it involve sudden or severe rash/hives, skin peeling, or any reaction on the inside of your mouth or nose? No Did you need to seek medical attention at a hospital or doctor's office? No When did it last happen?  "in my 20's" If all above answers are "NO", may proceed with cephalosporin use.     Review of Systems  Constitutional:  Positive for chills and fever.  HENT:  Positive for congestion and rhinorrhea. Negative for sinus pressure and sinus pain.   Respiratory:  Positive for cough. Negative for shortness of breath and wheezing.   Gastrointestinal:  Positive for diarrhea, nausea and vomiting. Negative for abdominal pain and blood in stool.      Objective:    Physical  Exam  Temp (!) 101 F (38.3 C)  Wt Readings from Last 3 Encounters:  12/08/20 170 lb 1.9 oz (77.2 kg)  05/14/20 169 lb (76.7 kg)  06/20/19 165 lb (74.8 kg)    Health Maintenance Due  Topic Date Due   PAP SMEAR-Modifier  Never done   Zoster Vaccines- Shingrix (1 of 2) Never done   MAMMOGRAM  05/08/2016   COVID-19 Vaccine (3 - Booster for Moderna series) 03/23/2020   INFLUENZA VACCINE  02/02/2021    There are no preventive care reminders to display for this patient.   Lab Results  Component Value Date   TSH 1.76 04/23/2020  Lab Results  Component Value Date   WBC 7.3 04/23/2020   HGB 13.7 04/23/2020   HCT 40 04/23/2020   MCV 92.1 10/07/2018   PLT 288 10/07/2018   Lab Results  Component Value Date   NA 142 10/07/2018   K 3.3 (L) 10/07/2018   CO2 24 10/07/2018   GLUCOSE 98 10/07/2018   BUN 20 10/07/2018   CREATININE 0.6 04/23/2020   BILITOT 0.7 10/07/2018   ALKPHOS 89 04/23/2020   AST 16 04/23/2020   ALT 19 04/23/2020   PROT 7.7 10/07/2018   ALBUMIN 4.1 04/23/2020   CALCIUM 9.3 04/23/2020   ANIONGAP 10 10/07/2018   Lab Results  Component Value Date   CHOL 213 (A) 04/23/2020   Lab Results  Component Value Date   HDL 56 04/23/2020   Lab Results  Component Value Date   LDLCALC 137 04/23/2020   Lab Results  Component Value Date   TRIG 98 04/23/2020   No results found for: CHOLHDL Lab Results  Component Value Date   HGBA1C 5.4 04/23/2020       Assessment & Plan:   Problem List Items Addressed This Visit       Other   Viral illness    -having diarrhea and vomiting; concerned for dehydration as she has been losing fluids via GI tract and unable to replace them -Rx. Imodium and zofran -we discussed drinking clear fluids like water or ginger ale; if unable to keep these down, she will need to go to emergency department for IV hydration -Rx. tussinex for cough -Rx. Tylenol for fever -will get COVID testing; d/t date of symptom onset will Rx.  paxlovid and may stop if COVID is negative      Relevant Medications   ondansetron (ZOFRAN-ODT) 4 MG disintegrating tablet   loperamide (IMODIUM A-D) 2 MG tablet   chlorpheniramine-HYDROcodone (TUSSIONEX PENNKINETIC ER) 10-8 MG/5ML SUER   acetaminophen (TYLENOL) 325 MG tablet   nirmatrelvir/ritonavir EUA (PAXLOVID) 20 x 150 MG & 10 x '100MG'$  TABS   Other Relevant Orders   Coronavirus (COVID-19) with Influenza A and Influenza B     Meds ordered this encounter  Medications   ondansetron (ZOFRAN-ODT) 4 MG disintegrating tablet    Sig: Take 1 tablet (4 mg total) by mouth every 8 (eight) hours as needed for nausea or vomiting.    Dispense:  20 tablet    Refill:  0   loperamide (IMODIUM A-D) 2 MG tablet    Sig: Take 1 tablet (2 mg total) by mouth 4 (four) times daily as needed for diarrhea or loose stools.    Dispense:  30 tablet    Refill:  0   chlorpheniramine-HYDROcodone (TUSSIONEX PENNKINETIC ER) 10-8 MG/5ML SUER    Sig: Take 5 mLs by mouth every 12 (twelve) hours as needed for cough.    Dispense:  115 mL    Refill:  0   acetaminophen (TYLENOL) 325 MG tablet    Sig: Take 2 tablets (650 mg total) by mouth every 6 (six) hours as needed for fever.    Dispense:  42 tablet    Refill:  0   nirmatrelvir/ritonavir EUA (PAXLOVID) 20 x 150 MG & 10 x '100MG'$  TABS    Sig: Take 3 tablets by mouth 2 (two) times daily for 5 days. (Take nirmatrelvir 150 mg two tablets twice daily for 5 days and ritonavir 100 mg one tablet twice daily for 5 days) Patient GFR is 100    Dispense:  30 tablet  Refill:  0   Date:  02/19/2021   Location of Patient: Home Location of Provider: Office Consent was obtain for visit to be over via telehealth. I verified that I am speaking with the correct person using two identifiers.  I connected with  Tonya Bowman on 02/19/21 via telephone and verified that I am speaking with the correct person using two identifiers.   I discussed the limitations of evaluation and  management by telemedicine. The patient expressed understanding and agreed to proceed.  Time spent: 9 minutes   Noreene Larsson, NP

## 2021-02-19 NOTE — Patient Instructions (Signed)
If vomiting and diarrhea persist despite medication or you feel weaker, go to emergency department for IV rehydration.

## 2021-05-22 ENCOUNTER — Other Ambulatory Visit: Payer: Self-pay

## 2021-05-22 ENCOUNTER — Telehealth: Payer: Self-pay | Admitting: Nurse Practitioner

## 2021-05-22 MED ORDER — RIZATRIPTAN BENZOATE 10 MG PO TABS: 10.0000 mg | ORAL_TABLET | ORAL | 1 refills | Status: DC | PRN

## 2021-05-22 NOTE — Telephone Encounter (Signed)
Pt is needing a refill on rizatriptan (MAXALT) 10 MG tablet

## 2021-05-22 NOTE — Telephone Encounter (Signed)
Already refilled

## 2021-07-02 ENCOUNTER — Telehealth: Payer: 59 | Admitting: Nurse Practitioner

## 2021-10-16 ENCOUNTER — Other Ambulatory Visit: Payer: Self-pay | Admitting: Nurse Practitioner

## 2021-10-16 ENCOUNTER — Telehealth: Payer: Self-pay | Admitting: Nurse Practitioner

## 2021-10-16 ENCOUNTER — Other Ambulatory Visit: Payer: Self-pay

## 2021-10-16 MED ORDER — RIZATRIPTAN BENZOATE 10 MG PO TABS: 10.0000 mg | ORAL_TABLET | ORAL | 1 refills | Status: DC | PRN

## 2021-10-16 NOTE — Telephone Encounter (Signed)
Patient called in regard to rizatriptan (MAXALT) 10 MG tablet  ? ?Patient is out of med. ?Has funeral today at 1:00 and would like to have meds before then. ? ?Patient had med review appt scheduled for 4/18  ?

## 2021-10-16 NOTE — Telephone Encounter (Signed)
Please advise last filled 05/22/21 by Dr Moshe Cipro with 10 pills ?

## 2021-10-20 ENCOUNTER — Ambulatory Visit (INDEPENDENT_AMBULATORY_CARE_PROVIDER_SITE_OTHER): Payer: 59 | Admitting: Nurse Practitioner

## 2021-10-20 ENCOUNTER — Encounter: Payer: Self-pay | Admitting: Nurse Practitioner

## 2021-10-20 DIAGNOSIS — G43709 Chronic migraine without aura, not intractable, without status migrainosus: Secondary | ICD-10-CM

## 2021-10-20 DIAGNOSIS — R0683 Snoring: Secondary | ICD-10-CM

## 2021-10-20 MED ORDER — RIZATRIPTAN BENZOATE 10 MG PO TABS: 10.0000 mg | ORAL_TABLET | ORAL | 2 refills | Status: AC | PRN

## 2021-10-20 NOTE — Assessment & Plan Note (Signed)
Chronic condition well-controlled on Maxalt 10 mg as needed ?90-day refills sent in today.  ?Continue medication as needed ?

## 2021-10-20 NOTE — Assessment & Plan Note (Signed)
Home sleep study ordered today to screen for sleep apnea.  ?

## 2021-10-20 NOTE — Progress Notes (Signed)
Virtual Visit via Telephone Note ? ?I connected with Tonya Bowman on 10/20/21 at  4:40 PM EDT by telephone and verified that I am speaking with the correct person using two identifiers.  I spent 8 minutes on this telephone encounter ? ?Location: ?Patient: home ?Provider: office ?  ?I discussed the limitations, risks, security and privacy concerns of performing an evaluation and management service by telephone and the availability of in person appointments. I also discussed with the patient that there may be a patient responsible charge related to this service. The patient expressed understanding and agreed to proceed. ? ? ?History of Present Illness: ?Ms. Tonya Bowman with past medical history of chronic migraine without aura not intractable without status migrainous, hypertension, snoring, overweight, hyperlipidemia presents to discuss her medications and referred for sleep study.  ? ? chronic migraine without aura not intractable without status migrainous.takes maxalt '10mg'$  for chronic migraine would like medication refilled for a year, has migraine HA about 2 times in a month, was previously seeing neurology.  Seizures, neurology, vomiting ? ?Snoring . Would like a sleep study previous PCP , was going to set her up fro sleep study but was never done, snores at night, states that she was told that she quits breathing when sleeping, feels tired when she wakes up in the morning, doses off easily during day time.  ?  ?Observations/Objective: ? ? ?Assessment and Plan: ?Chronic migraine without aura, not intractable, without status migrainosus ?Chronic condition well-controlled on Maxalt 10 mg as needed ?90-day refills sent in today.  ?Continue medication as needed ? ?Snoring ?Home sleep study ordered today to screen for sleep apnea.   ? ?Follow Up Instructions: ? ?  ?I discussed the assessment and treatment plan with the patient. The patient was provided an opportunity to ask questions and all were answered. The patient  agreed with the plan and demonstrated an understanding of the instructions. ?  ?The patient was advised to call back or seek an in-person evaluation if the symptoms worsen or if the condition fails to improve as anticipated.  ?

## 2021-10-26 ENCOUNTER — Telehealth: Payer: Self-pay

## 2021-10-26 NOTE — Telephone Encounter (Signed)
Patient called said she does not want to get this sleep study from [place], she is not comfortable giving her information to someone else out of state.  She would like to stay here close to home. Please contact patient back # [phone]. ?

## 2021-10-26 NOTE — Telephone Encounter (Signed)
Please advise pt doesn't want to do the home sleep study ?

## 2021-10-27 NOTE — Telephone Encounter (Signed)
Called pt advised of option of referring her to pulmonary pt declined and said that she can't at this time pt is going to find out from a friend who they used and contact us back ?

## 2023-10-11 ENCOUNTER — Telehealth: Payer: Self-pay

## 2023-10-11 NOTE — Telephone Encounter (Signed)
 Copied from CRM [id number]. Topic: Appointments - Transfer of Care >> Oct 11, 2023  8:42 AM Elle L wrote: Pt is requesting to transfer FROM: [facility] Pt is requesting to transfer TO: [facility] Reason for requested transfer: Provider left the office It is the responsibility of the team the patient would like to transfer to [facility] [place] [facility] to reach out to the patient if for any reason this transfer is not acceptable.

## 2023-10-11 NOTE — Telephone Encounter (Signed)
 This is a [facility] patient- not [facility]. Thanks

## 2023-10-25 ENCOUNTER — Ambulatory Visit: Admitting: Physician Assistant

## 2023-10-26 ENCOUNTER — Other Ambulatory Visit: Payer: Self-pay

## 2023-10-26 ENCOUNTER — Emergency Department (HOSPITAL_COMMUNITY)

## 2023-10-26 ENCOUNTER — Ambulatory Visit: Admitting: Physician Assistant

## 2023-10-26 ENCOUNTER — Encounter (HOSPITAL_COMMUNITY): Payer: Self-pay

## 2023-10-26 ENCOUNTER — Emergency Department (HOSPITAL_COMMUNITY)
Admission: EM | Admit: 2023-10-26 | Discharge: 2023-10-26 | Disposition: A | Discharge status: Home / Self Care | Attending: Emergency Medicine | Admitting: Emergency Medicine

## 2023-10-26 DIAGNOSIS — Z79899 Other long term (current) drug therapy: Secondary | ICD-10-CM | POA: Insufficient documentation

## 2023-10-26 DIAGNOSIS — I10 Essential (primary) hypertension: Secondary | ICD-10-CM

## 2023-10-26 DIAGNOSIS — Z853 Personal history of malignant neoplasm of breast: Secondary | ICD-10-CM | POA: Diagnosis not present

## 2023-10-26 DIAGNOSIS — R519 Headache, unspecified: Secondary | ICD-10-CM | POA: Diagnosis present

## 2023-10-26 HISTORY — DX: Carcinoma in situ of skin of trunk: D04.5

## 2023-10-26 HISTORY — DX: Malignant (primary) neoplasm, unspecified: C80.1

## 2023-10-26 LAB — BASIC METABOLIC PANEL WITH GFR
Anion gap: 11 (ref 5–15)
BUN: 18 mg/dL (ref 6–20)
CO2: 23 mmol/L (ref 22–32)
Calcium: 9.6 mg/dL (ref 8.9–10.3)
Chloride: 103 mmol/L (ref 98–111)
Creatinine, Ser: 0.65 mg/dL (ref 0.44–1.00)
GFR, Estimated: >60 mL/min (ref >60)
Glucose, Bld: 102 mg/dL — ABNORMAL HIGH (ref 70–99)
Potassium: 3.4 mmol/L — ABNORMAL LOW (ref 3.5–5.1)
Sodium: 137 mmol/L (ref 135–145)

## 2023-10-26 LAB — CBC WITH DIFFERENTIAL/PLATELET
Abs Immature Granulocytes: 0.03 10*3/uL (ref 0.00–0.07)
Basophils Absolute: 0.1 10*3/uL (ref 0.0–0.1)
Basophils Relative: 1 %
Eosinophils Absolute: 0.1 10*3/uL (ref 0.0–0.5)
Eosinophils Relative: 1 %
HCT: 47.3 % — ABNORMAL HIGH (ref 36.0–46.0)
Hemoglobin: 15.7 g/dL — ABNORMAL HIGH (ref 12.0–15.0)
Immature Granulocytes: 0 %
Lymphocytes Relative: 31 %
Lymphs Abs: 2.6 10*3/uL (ref 0.7–4.0)
MCH: 29.6 pg (ref 26.0–34.0)
MCHC: 33.2 g/dL (ref 30.0–36.0)
MCV: 89.1 fL (ref 80.0–100.0)
Monocytes Absolute: 0.6 10*3/uL (ref 0.1–1.0)
Monocytes Relative: 7 %
Neutro Abs: 5 10*3/uL (ref 1.7–7.7)
Neutrophils Relative %: 60 %
Platelets: 337 10*3/uL (ref 150–400)
RBC: 5.31 MIL/uL — ABNORMAL HIGH (ref 3.87–5.11)
RDW: 12.9 % (ref 11.5–15.5)
WBC: 8.4 10*3/uL (ref 4.0–10.5)
nRBC: 0 % (ref 0.0–0.2)

## 2023-10-26 LAB — TROPONIN I (HIGH SENSITIVITY): Troponin I (High Sensitivity): 9 ng/L (ref <18)

## 2023-10-26 MED ORDER — AMLODIPINE BESYLATE 5 MG PO TABS: 5.0000 mg | ORAL_TABLET | Freq: Every day | ORAL | 0 refills | Status: AC

## 2023-10-26 NOTE — ED Triage Notes (Signed)
 Pt arrived via POV c/o hypertension since Monday this week. Pt reports checking BP at home and at recent doctors appointments and her BP was in the 170's/105. Pt endorses dizziness, lightheaded, intermittent left arm numbness and reports her lips have felt numb this morning. Pt recently started on Olmasartan-hydrochlorothiazide 20-12.5 and reports symptoms of numbness began after taking medication.

## 2023-10-26 NOTE — ED Provider Notes (Signed)
  Patient signed out to me by Baxter Limber, PA-C pending completion of workup.  Patient recently started antihypertensive medication.  She was started on olmesartan/hydrochlorothiazide.  She took her first dose yesterday and states the medication made her feel worse.  Reported blood pressures in the 170s over 100s.  Denies any chest pain or shortness of breath  See previous provider note for complete H&P   Workup this evening was unremarkable.  Chest x-ray without acute cardiopulmonary findings, and CT of the head was reassuring.  Blood pressure 123/96 during my evaluation of the patient.  Currently asymptomatic  Feel that she is appropriate for discharge home at this time.  She has been written prescription for amlodipine  that she can start tomorrow and she will discontinue the olmesartan/hydrochlorothiazide.  Have discussed keeping a blood pressure diary and monitor her blood pressure once daily.  She will follow-up with her primary care provider for recheck as appointment for early May.     Catherne Clubs, PA-C 10/26/23 2058    Cheyenne Cotta, MD 10/28/23 [phone]

## 2023-10-26 NOTE — ED Notes (Signed)
 Pt reports on Monday she had a procedure done to remove cancer from her left breast, and currently has sutures in place as well.

## 2023-10-26 NOTE — ED Provider Notes (Signed)
 [facility] AT [facility] Provider Note   CSN: [id number] Arrival date & time: 10/26/23  1338     History  Chief Complaint  Patient presents with   Hypertension    Tonya Bowman is a 58 y.o. female.  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  Patient reports she did having her blood pressure 4 weeks, possibly having high diastolic numbers up to 110 115.  She saw her PCP and was prescribed Sartain HCTZ 20-12.5.  She took it last night for the first time as a surgical this morning around 7 AM and got in the shower she felt very lightheaded, felt some numbness in the left arm and became very worried, she also had numbness around her lips.  She mainly got a shower and checked her blood pressure, it was in the 170s systolic .  She was very worried and called a family member who encouraged her to call her PCP.  When she called the PCP they encouraged her to come to the ER for further evaluation.  She is also complaining of daily headaches.  Denies palpitations, denies chest pain, reports she is getting exertional shortness of breath for the past several weeks however,.   Patient is not sure how long the dizziness and numbness of the left arm and around her mouth lasted this morning but states that feeling is fully resolved now.  He is very concerned because she has family history of stroke.  To have any weakness, no vision changes, no speech difficulty.  Hypertension       Home Medications Prior to Admission medications   Medication Sig Start Date End Date Taking? Authorizing Provider  olmesartan-hydrochlorothiazide (BENICAR HCT) 20-12.5 MG tablet Take 1 tablet by mouth daily. 10/25/23  Yes [provider]  acetaminophen  (TYLENOL ) 325 MG tablet Take 2 tablets (650 mg total) by mouth every 6 (six) hours as needed for fever. Patient not taking: Reported on 10/20/2021 02/19/21    Artemio Larry, NP  ibuprofen (ADVIL) 200 MG tablet Take 200 mg by mouth every 6 (six) hours as needed. Takes 4 at one time    [provider]  rizatriptan  (MAXALT ) 10 MG tablet Take 1 tablet (10 mg total) by mouth as needed for migraine. May repeat in 2 hours if needed 10/20/21   Paseda, Folashade R, FNP  valACYclovir (VALTREX) 500 MG tablet Take 500 mg by mouth daily. 07/23/21   [provider]  Vitamin D , Ergocalciferol , (DRISDOL ) 1.25 MG (50000 UNIT) CAPS capsule TAKE 1 CAPSULE (50,000 UNITS TOTAL) BY MOUTH EVERY 7 (SEVEN) DAYS Patient not taking: Reported on 10/20/2021 11/24/20   Artemio Larry, NP      Allergies    Penicillins    Review of Systems   Review of Systems  Physical Exam Updated Vital Signs BP (!) 147/105 (BP Location: Right Arm)   Pulse (!) 103   Temp 98.4 F (36.9 C) (Oral)   Resp 16   Ht 5\' 2"  (1.575 m)   Wt 77.2 kg   SpO2 97%   BMI 31.13 kg/m  Physical Exam Vitals and nursing note reviewed.  Constitutional:      General: She is not in acute distress.    Appearance: She is well-developed.  HENT:     Head: Normocephalic and atraumatic.  Eyes:     Conjunctiva/sclera: Conjunctivae normal.  Cardiovascular:     Rate and Rhythm: Normal  rate and regular rhythm.     Heart sounds: No murmur heard. Pulmonary:     Effort: Pulmonary effort is normal. No respiratory distress.     Breath sounds: Normal breath sounds.  Abdominal:     Palpations: Abdomen is soft.     Tenderness: There is no abdominal tenderness.  Musculoskeletal:        General: No swelling.     Cervical back: Neck supple.  Skin:    General: Skin is warm and dry.     Capillary Refill: Capillary refill takes less than 2 seconds.  Neurological:     General: No focal deficit present.     Mental Status: She is alert and oriented to person, place, and time.     Sensory: No sensory deficit.     Motor: No weakness.  Psychiatric:        Mood and Affect: Mood normal.     ED Results  / Procedures / Treatments   Labs (all labs ordered are listed, but only abnormal results are displayed) Labs Reviewed  CBC WITH DIFFERENTIAL/PLATELET  BASIC METABOLIC PANEL WITH GFR    EKG None  Radiology No results found.  Procedures Procedures    Medications Ordered in ED Medications - No data to display  ED Course/ Medical Decision Making/ A&P                                 Medical Decision Making Differential diagnosis includes but is not limited to hypertension, CVA, ACS, arrhythmia, cranial hemorrhage, other   ED course: Patient having hypertension for the past several weeks, started new medication yesterday olmesartan HCTZ but states she thinks it made her worse, blood pressure is lower today however she states she had numbness in the shower still very lightheaded and had some numbness around her lips and of her left arm.  She never had any weakness or other symptoms to indicate this was a CVA versus TIA, did not pass out or have any other symptoms, not having severe headache but has been having daily headaches.  She is very worried because she states when she does her blood pressure that time is higher than it has been.  I discussed with the patient we can obtain labs and CT head, although exam is overall very reassuring I suspect it was possible that she had some dizziness to her blood pressure being relatively low compared to her baseline of 170-180/110-115 causing her to be lightheaded.  It was higher however when she got the shower so it is not clear if she had a transient lower blood pressure or not.  Blood pressure here is 147/105 and she is asymptomatic.  We did have a long discussion that if workup is negative she will need gradual lowering of blood pressure.  Case was handed off to [facility] after discussion of patient's presentation and plan.  I expect if workup is negative she will go home to follow-up as outpatient with her blood pressure medication as she  feels that the olmesartan HCTZ made her worse.  Amount and/or Complexity of Data Reviewed External Data Reviewed: labs and notes. Labs: ordered. Radiology: ordered.  Risk Prescription drug management.           Final Clinical Impression(s) / ED Diagnoses Final diagnoses:  Hypertension, unspecified type    Rx / DC Orders ED Discharge Orders     None  Joshua Nieves 10/26/23 1906    Cheyenne Cotta, MD 10/28/23 1230

## 2023-10-26 NOTE — ED Notes (Signed)
 Monitor reading ST elevation in leads V1, V2, repeat EKG done and given to Dr Zammit. Pt denies CP/SHOB. C/o HA. MD aware.

## 2023-10-26 NOTE — Discharge Instructions (Signed)
 Stop the olmesartan hydrochlorothiazide.  You may start the amlodipine  tomorrow, take as directed.  As discussed keep a blood pressure log with your daily readings and try to take your blood pressure once a day close to the same time each day.  Take this with you when you follow back up with your primary care provider.  Return to the emergency department if you develop any new or worsening symptoms

## 2023-11-14 ENCOUNTER — Ambulatory Visit: Payer: Self-pay

## 2023-11-14 NOTE — Telephone Encounter (Signed)
 Summary: headaches//high blood pressure   Copied From CRM [phone]. Reason for Triage: The patient went to the emergency room and was prescribed amLODipine  (NORVASC ) 5 MG tablet. She had enough to last until her new patient appointment but it had to be rescheduled due to the provider being out of the office and will now run out before her new appointment. She is still experiencing headaches and high blood pressure of 138/91. The patient's call back number is [phone]      Chief Complaint: Information Only Symptoms: None  Disposition: [] ED /[] Urgent Care (no appt availability in office) / [] Appointment(In office/virtual)/ []  [facility]/ [] Home Care/ [] Refused Recommended Disposition /[x] [facility]/ []  Follow-up with PCP Additional Notes: Paient concerned about running out of medication and needing prescription refilled. Advised patient that she may go to [facility] to get medication refilled until her new patient appt. Pt stated that "that's not my fault that the office keeps rescheduling me." But she also verbalized understanding.  Reason for Disposition  [1] Prescription refill request for ESSENTIAL medicine (i.e., likelihood of harm to patient if not taken) AND [2] triager unable to refill per department policy  Answer Assessment - Initial Assessment Questions 1. DRUG NAME: "What medicine do you need to have refilled?"     Amlodipine   2. REFILLS REMAINING: "How many refills are remaining?" (Note: The label on the medicine or pill bottle will show how many refills are remaining. If there are no refills remaining, then a renewal may be needed.)     None  3. EXPIRATION DATE: "What is the expiration date?" (Note: The label states when the prescription will expire, and thus can no longer be refilled.)     10/25/24  4. PRESCRIBING HCP: "Who prescribed it?" Reason: If prescribed by specialist, call should be referred to that group.     Alexa Hymen, PA from the  ED  5. SYMPTOMS: "Do you have any symptoms?"     No  6. PREGNANCY: "Is there any chance that you are pregnant?" "When was your last menstrual period?"     No  Protocols used: Medication Refill and Renewal Call-A-AH

## 2023-12-12 ENCOUNTER — Encounter: Admitting: Physician Assistant

## 2023-12-21 ENCOUNTER — Encounter: Admitting: Physician Assistant

## 2024-05-18 ENCOUNTER — Encounter (INDEPENDENT_AMBULATORY_CARE_PROVIDER_SITE_OTHER): Payer: Self-pay | Admitting: *Deleted

## 2024-07-20 ENCOUNTER — Ambulatory Visit: Admitting: Physician Assistant

## 2024-07-20 ENCOUNTER — Encounter: Payer: Self-pay | Admitting: Physician Assistant

## 2024-07-20 ENCOUNTER — Other Ambulatory Visit (INDEPENDENT_AMBULATORY_CARE_PROVIDER_SITE_OTHER): Payer: Self-pay

## 2024-07-20 DIAGNOSIS — M546 Pain in thoracic spine: Secondary | ICD-10-CM

## 2024-07-20 MED ORDER — METHOCARBAMOL 500 MG PO TABS: 500.0000 mg | ORAL_TABLET | Freq: Four times a day (QID) | ORAL | 0 refills | Status: AC | PRN

## 2024-07-20 MED ORDER — METHYLPREDNISOLONE 4 MG PO TBPK: ORAL_TABLET | ORAL | 0 refills | Status: AC
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
